# Patient Record
Sex: Female | Born: 2005 | Race: Black or African American | Hispanic: No | Marital: Single | Smoking: Never smoker
Health system: Southern US, Community
[De-identification: ages and names within clinical notes are randomized; demographics above are authoritative.]

## PROBLEM LIST (undated history)

## (undated) ENCOUNTER — Inpatient Hospital Stay (HOSPITAL_COMMUNITY): Payer: Self-pay

## (undated) ENCOUNTER — Ambulatory Visit: Payer: Self-pay | Source: Home / Self Care

## (undated) DIAGNOSIS — O139 Gestational [pregnancy-induced] hypertension without significant proteinuria, unspecified trimester: Secondary | ICD-10-CM

## (undated) DIAGNOSIS — A749 Chlamydial infection, unspecified: Secondary | ICD-10-CM

## (undated) HISTORY — PX: NO PAST SURGERIES: SHX2092

## (undated) HISTORY — DX: Gestational (pregnancy-induced) hypertension without significant proteinuria, unspecified trimester: O13.9

---
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
## 2023-01-19 IMAGING — US US PELVIS COMPLETE WITH TRANSVAGINAL
1 series · 13 of 25 positions shown · non-contrast
Comparison: None

CLINICAL DATA: Initial evaluation for lost IUD threads.



[Series 1: us pelvis complete with transvaginal · 54 acquisitions, 13 frames shown]
[im 1/54]
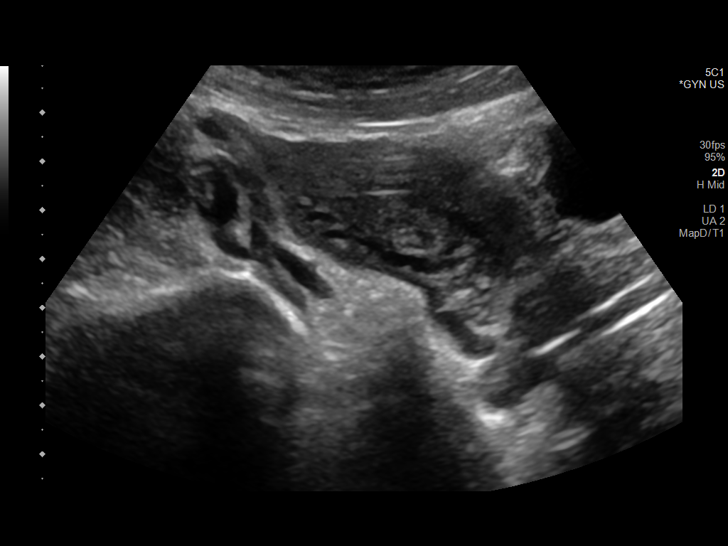
[im 5/54]
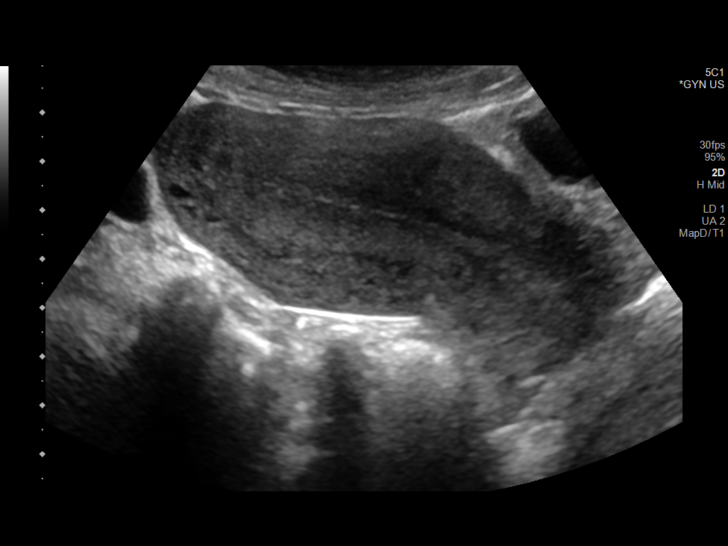
[im 9/54]
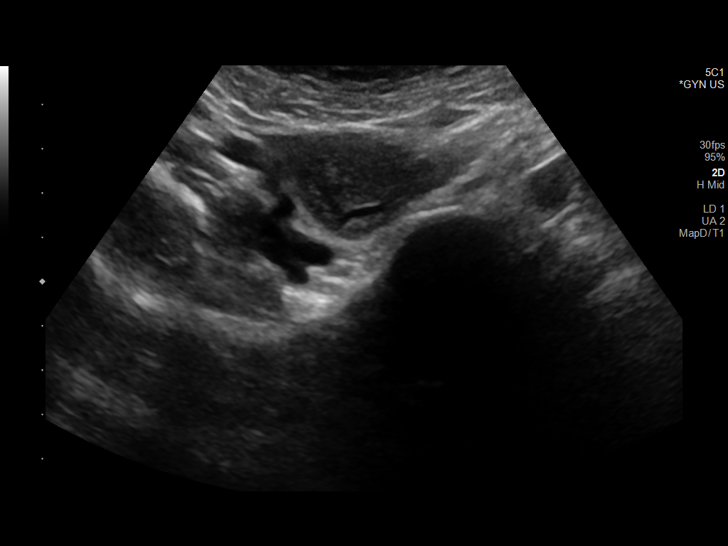
[im 14/54]
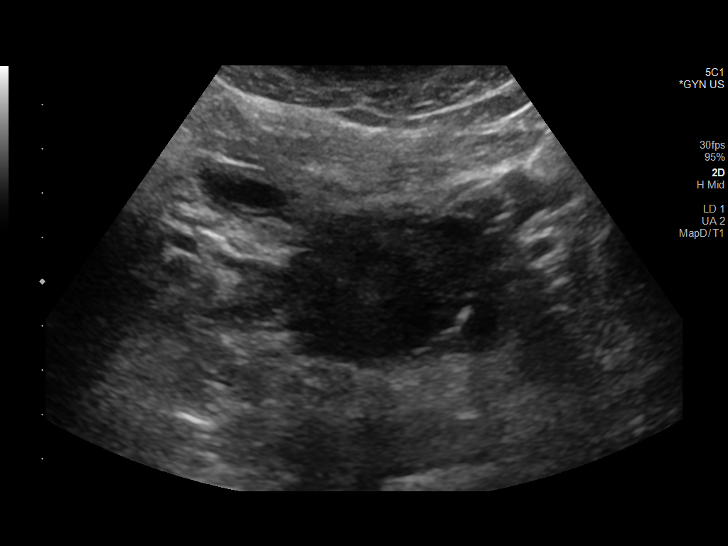
[im 18/54]
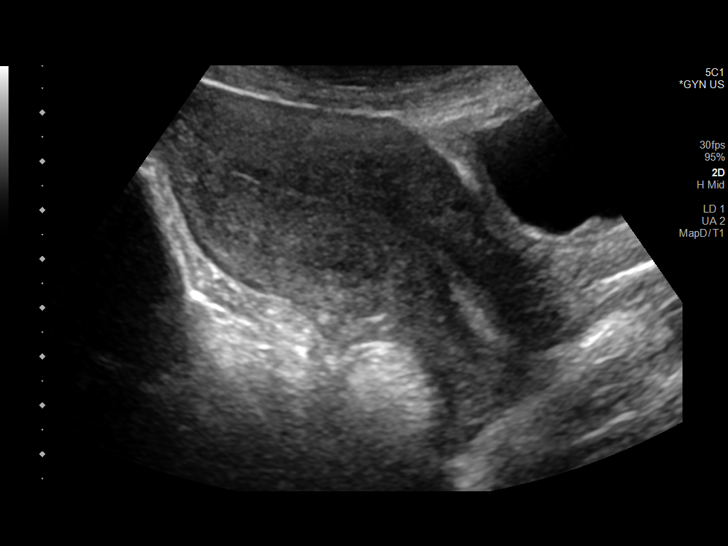
[im 23/54]
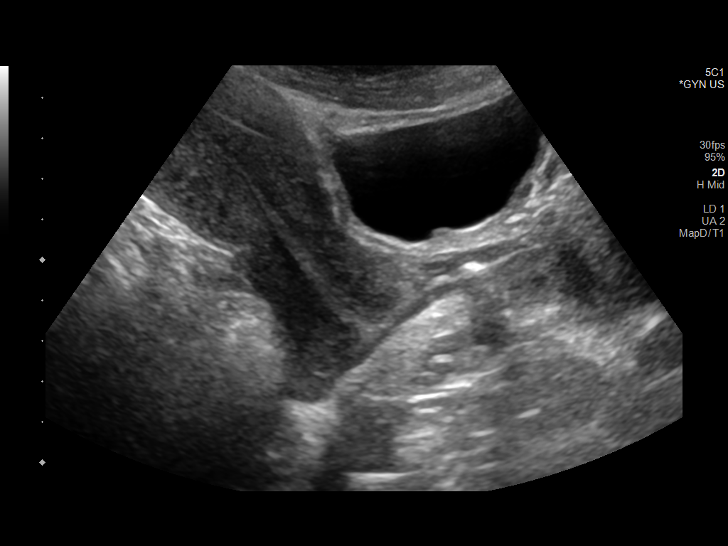
[im 27/54]
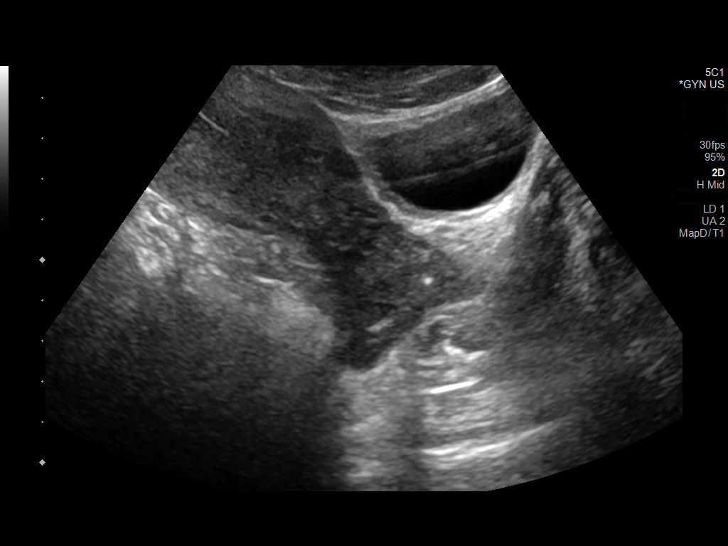
[im 31/54]
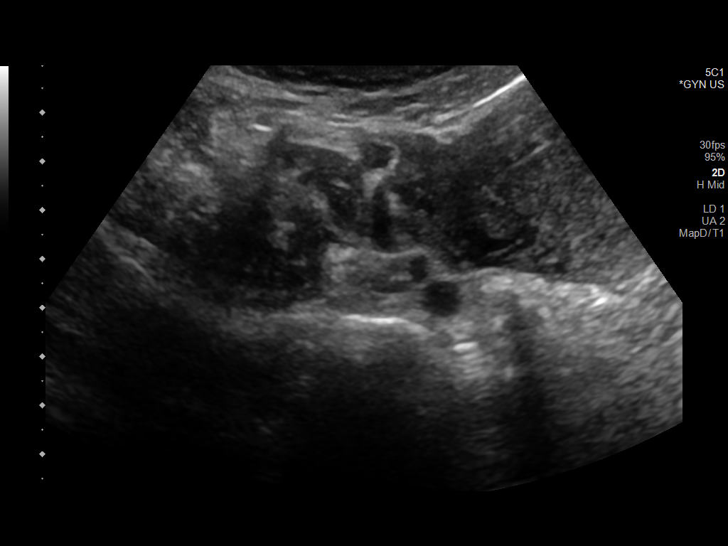
[im 36/54]
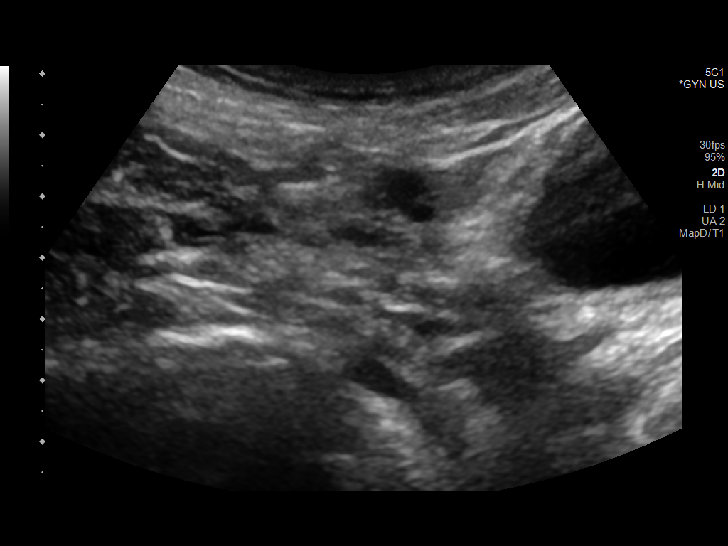
[im 40/54]
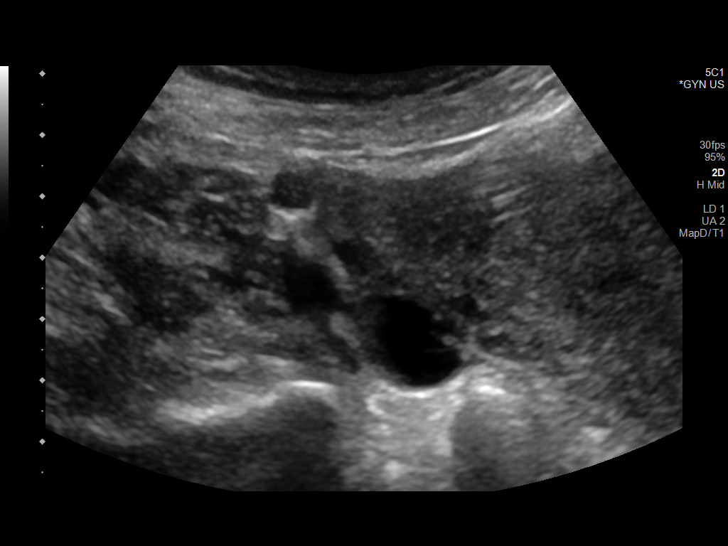
[im 45/54]
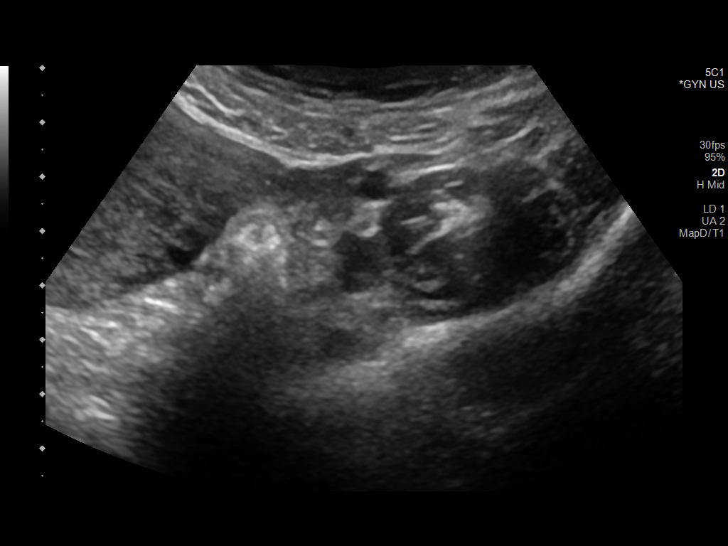
[im 49/54]
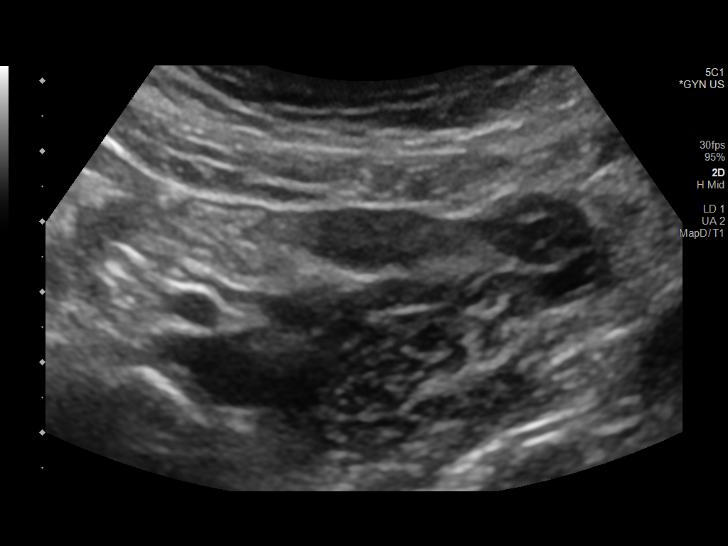
[im 54/54]
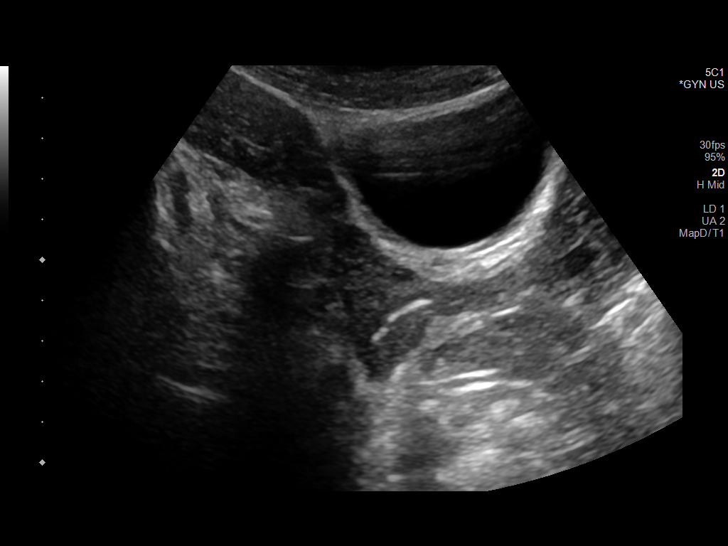

[13 of 25 positions shown; findings below may reference images not displayed]

FINDINGS: Uterus

Measurements: 9.3 x 3.8 x 5.7 cm = volume: 1 of 6 mL. Uterus is
anteverted. No discrete fibroid or other mass.

Endometrium

Thickness: 3.4 mm. No focal abnormality visualized. Linear echogenic
structure at the level of the lower uterine segment/endocervical
canal most likely reflects a malposition low lying IUD. This appears
to be transversely oriented (image 24).

Right ovary

Measurements: 3.5 x 2.3 x 1.7 cm = volume: 7.3 mL. Normal
appearance/no adnexal mass. 1.9 cm dominant follicle noted.

Left ovary

Measurements: 2.5 x 1.0 x 2.4 cm = volume: 3.0 mL. Normal
appearance/no adnexal mass.

Other findings

No abnormal free fluid.
IMPRESSION: 1. Malpositioned low lying IUD, transversely positioned at the level
of the lower uterine segment/endocervical canal.
2. Otherwise normal pelvic ultrasound.

## 2023-01-26 ENCOUNTER — Other Ambulatory Visit (HOSPITAL_COMMUNITY)
Admission: RE | Admit: 2023-01-26 | Discharge: 2023-01-26 | Disposition: A | Discharge status: Home / Self Care | Payer: Medicaid Other | Source: Ambulatory Visit | Attending: Family Medicine | Admitting: Family Medicine

## 2023-01-26 ENCOUNTER — Ambulatory Visit (INDEPENDENT_AMBULATORY_CARE_PROVIDER_SITE_OTHER): Payer: Medicaid Other

## 2023-01-26 VITALS — BP 116/63 | HR 77 | Wt 155.0 lb

## 2023-01-26 DIAGNOSIS — Z113 Encounter for screening for infections with a predominantly sexual mode of transmission: Secondary | ICD-10-CM | POA: Diagnosis not present

## 2023-01-26 NOTE — Progress Notes (Signed)
Patient presents for STD screening. Self swab was sent to the lab. chiquita l wilson, CMA

## 2023-01-27 LAB — CERVICOVAGINAL ANCILLARY ONLY
Bacterial Vaginitis (gardnerella): POSITIVE — AB
Candida Glabrata: NEGATIVE
Candida Vaginitis: NEGATIVE
Chlamydia: NEGATIVE
Comment: NEGATIVE
Comment: NEGATIVE
Comment: NEGATIVE
Comment: NEGATIVE
Comment: NEGATIVE
Comment: NORMAL
Neisseria Gonorrhea: NEGATIVE
Trichomonas: NEGATIVE

## 2023-01-30 MED ORDER — METRONIDAZOLE 1.3 % VA GEL: 1.0000 | Freq: Every day | VAGINAL | 0 refills | Status: DC

## 2023-01-30 NOTE — Addendum Note (Signed)
Addended by: Levie Heritage on: 01/30/2023 08:36 AM   Modules accepted: Orders

## 2023-01-31 ENCOUNTER — Other Ambulatory Visit: Payer: Self-pay

## 2023-01-31 ENCOUNTER — Other Ambulatory Visit (HOSPITAL_BASED_OUTPATIENT_CLINIC_OR_DEPARTMENT_OTHER): Payer: Self-pay

## 2023-01-31 DIAGNOSIS — B9689 Other specified bacterial agents as the cause of diseases classified elsewhere: Secondary | ICD-10-CM

## 2023-01-31 MED ORDER — METRONIDAZOLE 1.3 % VA GEL
1.0000 | Freq: Every day | VAGINAL | 0 refills | Status: AC
  Filled 2023-01-31: qty 5, 1d supply, fill #0

## 2023-01-31 NOTE — Progress Notes (Signed)
Patient called stating [facility] will not have her Rx for 2-3 days. Metrogel 1.3% GEL was sent to [facility] pharmacy. Understanding was voiced. Mary Weber l Blease Capaldi, CMA

## 2023-02-01 ENCOUNTER — Other Ambulatory Visit (HOSPITAL_BASED_OUTPATIENT_CLINIC_OR_DEPARTMENT_OTHER): Payer: Self-pay

## 2023-02-27 ENCOUNTER — Ambulatory Visit: Payer: Medicaid Other | Admitting: Obstetrics and Gynecology

## 2023-03-22 ENCOUNTER — Ambulatory Visit (INDEPENDENT_AMBULATORY_CARE_PROVIDER_SITE_OTHER): Payer: Medicaid Other

## 2023-03-22 VITALS — BP 120/58 | HR 68 | Wt 152.0 lb

## 2023-03-22 DIAGNOSIS — Z3201 Encounter for pregnancy test, result positive: Secondary | ICD-10-CM | POA: Diagnosis not present

## 2023-03-22 DIAGNOSIS — Z3490 Encounter for supervision of normal pregnancy, unspecified, unspecified trimester: Secondary | ICD-10-CM

## 2023-03-22 LAB — POCT URINE PREGNANCY: Preg Test, Ur: POSITIVE — AB

## 2023-03-22 NOTE — Progress Notes (Addendum)
Patient presents for UPT. UPT; positive. Advised patient to start prenatal vitamins and to schedule an appointment for prenatal care.  Keelyn Fjelstad l Fusae Florio, CMA   Attestation of Attending Supervision of CMA/RN: Evaluation and management procedures were performed by the nurse under my supervision and collaboration.  I have reviewed the nursing note and chart, and I agree with the management and plan.  Carolyn L. Harraway-Smith, M.D., [place] [place]

## 2023-03-23 ENCOUNTER — Other Ambulatory Visit: Payer: Self-pay | Admitting: Family Medicine

## 2023-03-23 ENCOUNTER — Ambulatory Visit (HOSPITAL_BASED_OUTPATIENT_CLINIC_OR_DEPARTMENT_OTHER)
Admission: RE | Admit: 2023-03-23 | Discharge: 2023-03-23 | Disposition: A | Discharge status: Home / Self Care | Payer: Medicaid Other | Source: Ambulatory Visit | Attending: Family Medicine | Admitting: Family Medicine

## 2023-03-23 DIAGNOSIS — Z3201 Encounter for pregnancy test, result positive: Secondary | ICD-10-CM

## 2023-03-23 DIAGNOSIS — Z3490 Encounter for supervision of normal pregnancy, unspecified, unspecified trimester: Secondary | ICD-10-CM

## 2023-03-23 DIAGNOSIS — Z3A01 Less than 8 weeks gestation of pregnancy: Secondary | ICD-10-CM | POA: Diagnosis not present

## 2023-03-23 DIAGNOSIS — Z3689 Encounter for other specified antenatal screening: Secondary | ICD-10-CM | POA: Insufficient documentation

## 2023-03-29 ENCOUNTER — Other Ambulatory Visit: Payer: Self-pay

## 2023-03-29 MED ORDER — VITAFOL FE+ 90-0.6-0.4-200 MG PO CAPS: 1.0000 | ORAL_CAPSULE | Freq: Every day | ORAL | 10 refills | Status: DC

## 2023-03-29 NOTE — Progress Notes (Signed)
Patient called requesting script for prenatal vitamins. Armandina Stammer RN

## 2023-03-30 ENCOUNTER — Other Ambulatory Visit (HOSPITAL_COMMUNITY)
Admission: RE | Admit: 2023-03-30 | Discharge: 2023-03-30 | Disposition: A | Discharge status: Home / Self Care | Payer: Medicaid Other | Source: Ambulatory Visit | Attending: Family Medicine | Admitting: Family Medicine

## 2023-03-30 ENCOUNTER — Ambulatory Visit (INDEPENDENT_AMBULATORY_CARE_PROVIDER_SITE_OTHER): Payer: Medicaid Other | Admitting: Family Medicine

## 2023-03-30 ENCOUNTER — Encounter: Payer: Self-pay | Admitting: Family Medicine

## 2023-03-30 VITALS — BP 118/62 | HR 76 | Wt 149.0 lb

## 2023-03-30 DIAGNOSIS — O3680X0 Pregnancy with inconclusive fetal viability, not applicable or unspecified: Secondary | ICD-10-CM

## 2023-03-30 DIAGNOSIS — Z1339 Encounter for screening examination for other mental health and behavioral disorders: Secondary | ICD-10-CM

## 2023-03-30 DIAGNOSIS — Z6830 Body mass index (BMI) 30.0-30.9, adult: Secondary | ICD-10-CM | POA: Diagnosis not present

## 2023-03-30 DIAGNOSIS — Z3480 Encounter for supervision of other normal pregnancy, unspecified trimester: Secondary | ICD-10-CM | POA: Insufficient documentation

## 2023-03-30 DIAGNOSIS — Z8759 Personal history of other complications of pregnancy, childbirth and the puerperium: Secondary | ICD-10-CM | POA: Diagnosis not present

## 2023-03-30 DIAGNOSIS — Z3A01 Less than 8 weeks gestation of pregnancy: Secondary | ICD-10-CM | POA: Diagnosis not present

## 2023-03-30 MED ORDER — ONDANSETRON 4 MG PO TBDP
4.0000 mg | ORAL_TABLET | Freq: Four times a day (QID) | ORAL | 3 refills | Status: DC | PRN
Start: 2023-03-30 — End: 2023-12-13

## 2023-03-30 MED ORDER — PRENATAL 28-0.8 MG PO TABS: 1.0000 | ORAL_TABLET | Freq: Every day | ORAL | 12 refills | Status: AC

## 2023-03-30 NOTE — Progress Notes (Signed)
Subjective:  Mary Weber is a G3P1001 [redacted]w[redacted]d confirmed by US done by me today being seen today for her first obstetrical visit.  Her obstetrical history is significant for  SVD with GHTN during labor . Patient does intend to breast feed. Pregnancy history fully reviewed.  Patient reports nausea.  BP 118/62   Pulse 76   Wt 149 lb (67.6 kg)   LMP 02/10/2023   BMI 30.09 kg/m   HISTORY: OB History  Gravida Para Term Preterm AB Living  3 1 1     1   SAB IAB Ectopic Multiple Live Births        0 1    # Outcome Date GA Lbr Len/2nd Weight Sex Type Anes PTL Lv  3 Current           2 Term 11/27/20 [redacted]w[redacted]d 11:50 / 00:28 6 lb 14.1 oz (3.121 kg) M Vag-Spont EPI  LIV  1 Gravida             Past Medical History:  Diagnosis Date   Chlamydia     Past Surgical History:  Procedure Laterality Date   NO PAST SURGERIES      Family History  Problem Relation Age of Onset   Healthy Mother    Healthy Father      Exam  BP 118/62   Pulse 76   Wt 149 lb (67.6 kg)   LMP 02/10/2023   BMI 30.09 kg/m   Chaperone present during exam  CONSTITUTIONAL: Well-developed, well-nourished female in no acute distress.  HENT:  Normocephalic, atraumatic, External right and left ear normal. Oropharynx is clear and moist EYES: Conjunctivae and EOM are normal. Pupils are equal, round, and reactive to light. No scleral icterus.  NECK: Normal range of motion, supple, no masses.  Normal thyroid.  CARDIOVASCULAR: Normal heart rate noted, regular rhythm RESPIRATORY: Clear to auscultation bilaterally. Effort and breath sounds normal, no problems with respiration noted. BREASTS: Declines ABDOMEN: Soft, normal bowel sounds, no distention noted.  No tenderness, rebound or guarding.  PELVIC: Declines MUSCULOSKELETAL: Normal range of motion. No tenderness.  No cyanosis, clubbing, or edema.  2+ distal pulses. SKIN: Skin is warm and dry. No rash noted. Not diaphoretic. No erythema. No pallor. NEUROLOGIC: Alert and  oriented to person, place, and time. Normal reflexes, muscle tone coordination. No cranial nerve deficit noted. PSYCHIATRIC: Normal mood and affect. Normal behavior. Normal judgment and thought content.    Assessment:    Pregnancy: G3P1001 Patient Active Problem List   Diagnosis Date Noted   Supervision of other normal pregnancy, antepartum 03/30/2023   BMI 30.0-30.9,adult 03/30/2023      Plan:   1. BMI 30.0-30.9,adult  2. Supervision of other normal pregnancy, antepartum FHT seen on [place] today - CBC/D/Plt+RPR+Rh+ABO+RubIgG... - Culture, OB Urine - GC/Chlamydia probe amp ([place])not at [facility] - [facility] AMB BABYSCRIPTS SCHEDULE OPTIMIZATION - [place] MFM OB DETAIL +14 WK; Future - US OB Comp Less 14 Wks; Future  3. Pregnancy with uncertain fetal viability, single or unspecified fetus  4. History of gestational hypertension ASA 81mg  at 12 weeks.     Initial labs obtained Continue prenatal vitamins Reviewed n/v relief measures and warning s/s to report Reviewed recommended weight gain based on pre-gravid BMI Encouraged well-balanced diet Genetic & carrier screening discussed: requests Panorama,  Ultrasound discussed; fetal survey: requested CCNC completed> form faxed if has or is planning to apply for medicaid The nature of [facility] [employer] with multiple MDs and other Advanced  Practice Providers was explained to patient; also emphasized that fellows, residents, and students are part of our team.  No indications for early GDM testing   Problem list reviewed and updated. 75% of 30 min visit spent on counseling and coordination of care.     Levie Heritage 03/30/2023

## 2023-03-31 ENCOUNTER — Telehealth (HOSPITAL_BASED_OUTPATIENT_CLINIC_OR_DEPARTMENT_OTHER): Payer: Self-pay

## 2023-03-31 DIAGNOSIS — Z8759 Personal history of other complications of pregnancy, childbirth and the puerperium: Secondary | ICD-10-CM | POA: Insufficient documentation

## 2023-03-31 LAB — GC/CHLAMYDIA PROBE AMP (~~LOC~~) NOT AT ARMC
Chlamydia: NEGATIVE
Comment: NEGATIVE
Comment: NORMAL
Neisseria Gonorrhea: NEGATIVE

## 2023-03-31 LAB — CBC/D/PLT+RPR+RH+ABO+RUBIGG...
Antibody Screen: NEGATIVE
Basophils Absolute: 0 10*3/uL (ref 0.0–0.2)
Basos: 1 %
EOS (ABSOLUTE): 0 10*3/uL (ref 0.0–0.4)
Eos: 0 %
HCV Ab: NONREACTIVE
HIV Screen 4th Generation wRfx: NONREACTIVE
Hematocrit: 37.9 % (ref 34.0–46.6)
Hemoglobin: 11.6 g/dL (ref 11.1–15.9)
Hepatitis B Surface Ag: NEGATIVE
Immature Grans (Abs): 0 10*3/uL (ref 0.0–0.1)
Immature Granulocytes: 0 %
Lymphocytes Absolute: 1.4 10*3/uL (ref 0.7–3.1)
Lymphs: 24 %
MCH: 27 pg (ref 26.6–33.0)
MCHC: 30.6 g/dL — ABNORMAL LOW (ref 31.5–35.7)
MCV: 88 fL (ref 79–97)
Monocytes Absolute: 0.4 10*3/uL (ref 0.1–0.9)
Monocytes: 6 %
Neutrophils Absolute: 4 10*3/uL (ref 1.4–7.0)
Neutrophils: 69 %
Platelets: 246 10*3/uL (ref 150–450)
RBC: 4.29 x10E6/uL (ref 3.77–5.28)
RDW: 12.6 % (ref 11.7–15.4)
RPR Ser Ql: NONREACTIVE
Rh Factor: POSITIVE
Rubella Antibodies, IGG: 18.1 index (ref >0.99)
WBC: 5.8 10*3/uL (ref 3.4–10.8)

## 2023-03-31 LAB — HCV INTERPRETATION

## 2023-04-01 ENCOUNTER — Ambulatory Visit (HOSPITAL_BASED_OUTPATIENT_CLINIC_OR_DEPARTMENT_OTHER): Payer: Medicaid Other

## 2023-04-01 LAB — URINE CULTURE, OB REFLEX

## 2023-04-01 LAB — CULTURE, OB URINE

## 2023-04-03 NOTE — Addendum Note (Signed)
Addended by: Levie Heritage on: 04/03/2023 08:35 AM   Modules accepted: Orders

## 2023-04-05 ENCOUNTER — Telehealth: Payer: Self-pay

## 2023-04-05 NOTE — Telephone Encounter (Signed)
Called patient to schedule a follow up US on 10/11. Was unable to leave a voicemail. Will send MyChart message to patient to give [place] a call.

## 2023-04-07 ENCOUNTER — Other Ambulatory Visit: Payer: Self-pay

## 2023-04-07 DIAGNOSIS — O219 Vomiting of pregnancy, unspecified: Secondary | ICD-10-CM

## 2023-04-07 MED ORDER — PROMETHAZINE HCL 25 MG PO TABS
25.0000 mg | ORAL_TABLET | Freq: Four times a day (QID) | ORAL | 0 refills | Status: AC | PRN
Start: 2023-04-07 — End: ?

## 2023-04-07 NOTE — Progress Notes (Signed)
Patient called requesting a medication for nausea and vomiting. Phenergan 25 mg take 1 tablet PO daily. Eryn Marandola l Nathan Stallworth, CMA

## 2023-04-12 ENCOUNTER — Telehealth: Payer: Self-pay

## 2023-04-12 ENCOUNTER — Other Ambulatory Visit: Payer: Self-pay

## 2023-04-12 ENCOUNTER — Inpatient Hospital Stay (HOSPITAL_COMMUNITY)
Admission: AD | Admit: 2023-04-12 | Discharge: 2023-04-12 | Disposition: A | Discharge status: Home / Self Care | Payer: Medicaid Other | Attending: Obstetrics & Gynecology | Admitting: Obstetrics & Gynecology

## 2023-04-12 DIAGNOSIS — Z6830 Body mass index (BMI) 30.0-30.9, adult: Secondary | ICD-10-CM

## 2023-04-12 DIAGNOSIS — O219 Vomiting of pregnancy, unspecified: Secondary | ICD-10-CM | POA: Diagnosis present

## 2023-04-12 DIAGNOSIS — O99281 Endocrine, nutritional and metabolic diseases complicating pregnancy, first trimester: Secondary | ICD-10-CM | POA: Insufficient documentation

## 2023-04-12 DIAGNOSIS — E860 Dehydration: Secondary | ICD-10-CM | POA: Diagnosis not present

## 2023-04-12 DIAGNOSIS — Z3480 Encounter for supervision of other normal pregnancy, unspecified trimester: Secondary | ICD-10-CM

## 2023-04-12 DIAGNOSIS — Z3A08 8 weeks gestation of pregnancy: Secondary | ICD-10-CM | POA: Insufficient documentation

## 2023-04-12 LAB — URINALYSIS, ROUTINE W REFLEX MICROSCOPIC
Bilirubin Urine: NEGATIVE
Glucose, UA: NEGATIVE mg/dL
Hgb urine dipstick: NEGATIVE
Ketones, ur: 80 mg/dL — AB
Leukocytes,Ua: NEGATIVE
Nitrite: NEGATIVE
Protein, ur: 100 mg/dL — AB
Specific Gravity, Urine: 1.029 (ref 1.005–1.030)
pH: 6 (ref 5.0–8.0)

## 2023-04-12 MED ORDER — SCOPOLAMINE 1 MG/3DAYS TD PT72
1.0000 | MEDICATED_PATCH | Freq: Once | TRANSDERMAL | Status: DC
  Administered 2023-04-12: 1.5 mg via TRANSDERMAL
  Filled 2023-04-12: qty 1

## 2023-04-12 MED ORDER — ONDANSETRON 4 MG PO TBDP
4.0000 mg | ORAL_TABLET | Freq: Once | ORAL | Status: AC
  Administered 2023-04-12: 4 mg via ORAL
  Filled 2023-04-12: qty 1

## 2023-04-12 MED ORDER — LACTATED RINGERS IV BOLUS
1000.0000 mL | Freq: Once | INTRAVENOUS | Status: AC
  Administered 2023-04-12: 1000 mL via INTRAVENOUS

## 2023-04-12 MED ORDER — SCOPOLAMINE 1 MG/3DAYS TD PT72
1.0000 | MEDICATED_PATCH | Freq: Once | TRANSDERMAL | Status: DC
  Filled 2023-04-12: qty 1

## 2023-04-12 MED ORDER — TRANSDERM-SCOP 1 MG/3DAYS TD PT72: 1.0000 | MEDICATED_PATCH | TRANSDERMAL | 0 refills | Status: AC

## 2023-04-12 MED ORDER — FAMOTIDINE 20 MG PO TABS
20.0000 mg | ORAL_TABLET | Freq: Once | ORAL | Status: AC
  Administered 2023-04-12: 20 mg via ORAL
  Filled 2023-04-12: qty 1

## 2023-04-12 MED ORDER — PANTOPRAZOLE SODIUM 20 MG PO TBEC: 20.0000 mg | DELAYED_RELEASE_TABLET | Freq: Every day | ORAL | 1 refills | Status: DC

## 2023-04-12 NOTE — MAU Provider Note (Signed)
Chief Complaint: No chief complaint on file.   Event Date/Time   First Provider Initiated Contact with Patient 04/12/23 2041        SUBJECTIVE HPI: Mary Weber is a 17 y.o. G3P1001 at [redacted]w[redacted]d by LMP who presents to maternity admissions reporting nausea and vomiting.  Has only vomited once all day. States cannot eat or drink but has been drinking orange juice and ate BBQ and Stew yesterday.  Has Zofran and Phenergan at home that don't help. . She denies vaginal bleeding, vaginal itching/burning, urinary symptoms, h/a, dizziness, or fever/chills.    Emesis  This is a recurrent problem. The current episode started today. The problem occurs less than 2 times per day. There has been no fever. Pertinent negatives include no abdominal pain or chills. Treatments tried: phenergan and zofran.   RN note: Mary Weber is a 17 y.o. at 109w5d here in MAU reporting: she's unable to keep anything down despite taking prescribed meds. Reports last took Phenergan @ 1230. States doesn't have an appetite, has vomited once in past 24 hours secondary hasn't eaten.  Denies VB. LMP: NA Onset of complaint: today  Past Medical History:  Diagnosis Date   Chlamydia    Past Surgical History:  Procedure Laterality Date   NO PAST SURGERIES     Social History   Socioeconomic History   Marital status: Single    Spouse name: Not on file   Number of children: Not on file   Years of education: Not on file   Highest education level: Not on file  Occupational History   Not on file  Tobacco Use   Smoking status: Never   Smokeless tobacco: Never  Vaping Use   Vaping status: Never Used  Substance and Sexual Activity   Alcohol use: Never   Drug use: Never   Sexual activity: Not Currently  Other Topics Concern   Not on file  Social History Narrative   Not on file   Social Determinants of Health   Financial Resource Strain: Not on file  Food Insecurity: Low Risk  (02/13/2023)   Received from [facility]    Hunger Vital Sign    Worried About Running Out of Food in the Last Year: Never true    Ran Out of Food in the Last Year: Never true  Transportation Needs: Not on file (02/13/2023)  Physical Activity: Not on file  Stress: Not on file  Social Connections: Not on file  Intimate Partner Violence: Not on file   No current facility-administered medications on file prior to encounter.   Current Outpatient Medications on File Prior to Encounter  Medication Sig Dispense Refill   ondansetron (ZOFRAN-ODT) 4 MG disintegrating tablet Take 1 tablet (4 mg total) by mouth every 6 (six) hours as needed for nausea. 60 tablet 3   Prenat-Fe Poly-Methfol-FA-DHA (VITAFOL FE+) 90-0.6-0.4-200 MG CAPS Take 1 Capful by mouth daily. (Patient not taking: Reported on 03/30/2023) 30 capsule 10   Prenatal 28-0.8 MG TABS Take 1 tablet by mouth daily. 30 tablet 12   promethazine (PHENERGAN) 25 MG tablet Take 1 tablet (25 mg total) by mouth every 6 (six) hours as needed for nausea or vomiting. 30 tablet 0   Allergies  Allergen Reactions   Bee Pollen Itching    Pt reports pollen     I have reviewed patient's Past Medical Hx, Surgical Hx, Family Hx, Social Hx, medications and allergies.   ROS:  Review of Systems  Constitutional:  Negative for chills.  Gastrointestinal:  Positive for vomiting. Negative for abdominal pain.   Review of Systems  Other systems negative   Physical Exam  Physical Exam Patient Vitals for the past 24 hrs:  BP Temp Temp src Pulse Resp SpO2 Height Weight  04/12/23 1735 115/66 98 F (36.7 C) Oral 88 18 100 % -- --  04/12/23 1731 -- -- -- -- -- -- 4\' 11"  (1.499 m) 64.3 kg   Constitutional: Well-developed, well-nourished female in no acute distress.  Cardiovascular: normal rate Respiratory: normal effort GI: Abd soft, non-tender. MS: Extremities nontender, no edema, normal ROM Neurologic: Alert and oriented x 4.  GU: Neg CVAT.  PELVIC EXAM: deferred   LAB RESULTS Results for  orders placed or performed during the hospital encounter of 04/12/23 (from the past 24 hour(s))  Urinalysis, Routine w reflex microscopic -Urine, Clean Catch     Status: Abnormal   Collection Time: 04/12/23  5:29 PM  Result Value Ref Range   Color, Urine YELLOW YELLOW   APPearance HAZY (A) CLEAR   Specific Gravity, Urine 1.029 1.005 - 1.030   pH 6.0 5.0 - 8.0   Glucose, UA NEGATIVE NEGATIVE mg/dL   Hgb urine dipstick NEGATIVE NEGATIVE   Bilirubin Urine NEGATIVE NEGATIVE   Ketones, ur 80 (A) NEGATIVE mg/dL   Protein, ur 086 (A) NEGATIVE mg/dL   Nitrite NEGATIVE NEGATIVE   Leukocytes,Ua NEGATIVE NEGATIVE   RBC / HPF 0-5 0 - 5 RBC/hpf   WBC, UA 0-5 0 - 5 WBC/hpf   Bacteria, UA RARE (A) NONE SEEN   Squamous Epithelial / HPF 11-20 0 - 5 /HPF   Mucus PRESENT     O/Positive/-- (09/19 0955)  IMAGING   MAU Management/MDM: I have reviewed the triage vital signs and the nursing notes.   Pertinent labs & imaging results that were available during my care of the patient were reviewed by me and considered in my medical decision making (see chart for details).      I have reviewed her medical records including past results, notes and treatments. Medical, Surgical, and family history were reviewed.  Medications and recent lab tests were reviewed    Treatments in MAU included LR infusion, pepcid, zofran and scopolamine patch.  Felt much better afterward and was able to tolerate PO intake.   ASSESSMENT Single IUP at [redacted]w[redacted]d Nausea and vomiting Mild dehydration  PLAN Discharge home Will add new Rx for Scopolamine and Protonix Advance diet as tolerated Pt stable at time of discharge. Encouraged to return here if she develops worsening of symptoms, increase in pain, fever, or other concerning symptoms.    Wynelle Bourgeois CNM, MSN Certified Nurse-Midwife 04/12/2023  8:42 PM

## 2023-04-12 NOTE — MAU Note (Signed)
Mary Weber is a 17 y.o. at [redacted]w[redacted]d here in MAU reporting: she's unable to keep anything down despite taking prescribed meds. Reports last took Phenergan @ 1230. States doesn't have an appetite, has vomited once in past 24 hours secondary hasn't eaten.  Denies VB. LMP: NA Onset of complaint: today Pain score: 0 Vitals:   04/12/23 1735  BP: 115/66  Pulse: 88  Resp: 18  Temp: 98 F (36.7 C)  SpO2: 100%     FHT:NA Lab orders placed from triage:   UA

## 2023-04-12 NOTE — Telephone Encounter (Signed)
Patient called stating she has tried taking Zofran and Phenergan but neither medication has provided any relief. Patient states she is unable to keep anything down. Advised patient to go to [facility] [address] to be seen. Understanding was voiced. Yi Falletta l Shae Hinnenkamp, CMA

## 2023-04-19 ENCOUNTER — Ambulatory Visit: Payer: Medicaid Other | Admitting: Obstetrics and Gynecology

## 2023-04-21 ENCOUNTER — Other Ambulatory Visit (INDEPENDENT_AMBULATORY_CARE_PROVIDER_SITE_OTHER): Payer: Medicaid Other

## 2023-04-21 ENCOUNTER — Encounter: Payer: Self-pay | Admitting: *Deleted

## 2023-04-21 ENCOUNTER — Ambulatory Visit: Payer: Medicaid Other | Admitting: *Deleted

## 2023-04-21 ENCOUNTER — Other Ambulatory Visit (HOSPITAL_BASED_OUTPATIENT_CLINIC_OR_DEPARTMENT_OTHER): Payer: Self-pay

## 2023-04-21 VITALS — BP 121/76 | HR 93 | Wt 139.0 lb

## 2023-04-21 DIAGNOSIS — Z3480 Encounter for supervision of other normal pregnancy, unspecified trimester: Secondary | ICD-10-CM

## 2023-04-21 DIAGNOSIS — Z3A09 9 weeks gestation of pregnancy: Secondary | ICD-10-CM | POA: Diagnosis not present

## 2023-04-21 DIAGNOSIS — Z3481 Encounter for supervision of other normal pregnancy, first trimester: Secondary | ICD-10-CM

## 2023-04-21 DIAGNOSIS — Z6830 Body mass index (BMI) 30.0-30.9, adult: Secondary | ICD-10-CM

## 2023-04-21 MED ORDER — TRANSDERM-SCOP 1 MG/3DAYS TD PT72
1.0000 | MEDICATED_PATCH | TRANSDERMAL | 12 refills | Status: DC
Start: 2023-04-21 — End: 2024-02-27
  Filled 2023-04-21 (×2): qty 10, 30d supply, fill #0

## 2023-04-21 NOTE — Progress Notes (Signed)
I explained I am completing a repeat scan today for viability. We discussed EDD of 11/17/2023, by Last Menstrual Period. Pt is G3P1001. I reviewed her allergies, medications and Medical/Surgical/OB history.    Patient Active Problem List   Diagnosis Date Noted   History of gestational hypertension 03/31/2023   Supervision of other normal pregnancy, antepartum 03/30/2023   BMI 30.0-30.9,adult 03/30/2023    Concerns addressed today  Patient informed that the ultrasound is considered a limited obstetric ultrasound and is not intended to be a complete ultrasound exam.  Patient also informed that the ultrasound is not being completed with the intent of assessing for fetal or placental anomalies or any pelvic abnormalities. Explained that the purpose of today's ultrasound is to assess for viability.  Patient acknowledges the purpose of the exam and the limitations of the study.     Pt to follow up at [facility], RN 04/21/2023  11:42 AM

## 2023-04-28 ENCOUNTER — Encounter: Payer: Medicaid Other | Admitting: Obstetrics and Gynecology

## 2023-05-08 ENCOUNTER — Other Ambulatory Visit (HOSPITAL_BASED_OUTPATIENT_CLINIC_OR_DEPARTMENT_OTHER): Payer: Self-pay

## 2023-05-09 ENCOUNTER — Other Ambulatory Visit (HOSPITAL_BASED_OUTPATIENT_CLINIC_OR_DEPARTMENT_OTHER): Payer: Self-pay

## 2023-05-19 ENCOUNTER — Other Ambulatory Visit (HOSPITAL_BASED_OUTPATIENT_CLINIC_OR_DEPARTMENT_OTHER): Payer: Self-pay

## 2023-05-25 ENCOUNTER — Encounter: Payer: Medicaid Other | Admitting: Family Medicine

## 2023-06-22 ENCOUNTER — Ambulatory Visit
Admission: RE | Admit: 2023-06-22 | Discharge: 2023-06-22 | Disposition: A | Discharge status: Home / Self Care | Payer: Self-pay | Source: Ambulatory Visit | Attending: Internal Medicine | Admitting: Internal Medicine

## 2023-06-22 ENCOUNTER — Ambulatory Visit: Payer: Self-pay

## 2023-06-22 ENCOUNTER — Ambulatory Visit: Payer: Medicaid Other | Attending: Family Medicine

## 2023-06-22 ENCOUNTER — Ambulatory Visit: Payer: Medicaid Other

## 2023-06-22 VITALS — BP 124/83 | HR 80 | Temp 98.8°F | Resp 16

## 2023-06-22 DIAGNOSIS — N760 Acute vaginitis: Secondary | ICD-10-CM | POA: Insufficient documentation

## 2023-06-22 DIAGNOSIS — R300 Dysuria: Secondary | ICD-10-CM | POA: Insufficient documentation

## 2023-06-22 LAB — POCT URINALYSIS DIP (MANUAL ENTRY)
Bilirubin, UA: NEGATIVE
Glucose, UA: NEGATIVE mg/dL
Ketones, POC UA: NEGATIVE mg/dL
Nitrite, UA: NEGATIVE
Protein Ur, POC: NEGATIVE mg/dL
Spec Grav, UA: >=1.03 — AB (ref 1.010–1.025)
Urobilinogen, UA: 0.2 U/dL
pH, UA: 6 (ref 5.0–8.0)

## 2023-06-22 LAB — POCT URINE PREGNANCY: Preg Test, Ur: NEGATIVE

## 2023-06-22 NOTE — ED Provider Notes (Signed)
[facility]  Note:  This document was prepared using Conservation officer, historic buildings and may include unintentional dictation errors.  MRN: [id number] DOB: Nov 12, 2005  Subjective:   Mary Weber is a 17 y.o. female presenting for 1 week history of acute onset persistent vaginal discharge and vaginal irritation.  In the past 3 days has started to have dysuria, slight urinary frequency.  Patient has had infrequent BV and yeast infection.  She tries to hydrate well and generally goes for 64 ounces daily.  She is requesting STI check but is not particularly concerned that this is the case.  No current facility-administered medications for this encounter.  Current Outpatient Medications:    ondansetron (ZOFRAN-ODT) 4 MG disintegrating tablet, Take 1 tablet (4 mg total) by mouth every 6 (six) hours as needed for nausea., Disp: 60 tablet, Rfl: 3   pantoprazole (PROTONIX) 20 MG tablet, Take 1 tablet (20 mg total) by mouth daily., Disp: 30 tablet, Rfl: 1   Prenat-Fe Poly-Methfol-FA-DHA (VITAFOL FE+) 90-0.6-0.4-200 MG CAPS, Take 1 Capful by mouth daily. (Patient not taking: Reported on 03/30/2023), Disp: 30 capsule, Rfl: 10   Prenatal 28-0.8 MG TABS, Take 1 tablet by mouth daily., Disp: 30 tablet, Rfl: 12   promethazine (PHENERGAN) 25 MG tablet, Take 1 tablet (25 mg total) by mouth every 6 (six) hours as needed for nausea or vomiting., Disp: 30 tablet, Rfl: 0   scopolamine (TRANSDERM-SCOP) 1 MG/3DAYS, Place 1 patch (1.5 mg total) onto the skin every 3 (three) days., Disp: 10 patch, Rfl: 0   scopolamine (TRANSDERM-SCOP) 1 MG/3DAYS, Place 1 patch (1.5 mg total) onto the skin every 3 (three) days., Disp: 10 patch, Rfl: 12   Allergies  Allergen Reactions   Bee Pollen Itching    Pt reports pollen     Past Medical History:  Diagnosis Date   Chlamydia      Past Surgical History:  Procedure Laterality Date   NO PAST SURGERIES      Family History  Problem Relation Age  of Onset   Healthy Mother    Healthy Father     Social History   Tobacco Use   Smoking status: Never   Smokeless tobacco: Never  Vaping Use   Vaping status: Never Used  Substance Use Topics   Alcohol use: Never   Drug use: Never    ROS   Objective:   Vitals: BP 124/83 (BP Location: Left Arm)   Pulse 80   Temp 98.8 F (37.1 C) (Oral)   Resp 16   LMP 06/22/2023   SpO2 99%   Physical Exam Constitutional:      General: She is not in acute distress.    Appearance: Normal appearance. She is well-developed. She is not ill-appearing, toxic-appearing or diaphoretic.  HENT:     Head: Normocephalic and atraumatic.     Nose: Nose normal.     Mouth/Throat:     Mouth: Mucous membranes are moist.     Pharynx: Oropharynx is clear.  Eyes:     General: No scleral icterus.       Right eye: No discharge.        Left eye: No discharge.     Extraocular Movements: Extraocular movements intact.     Conjunctiva/sclera: Conjunctivae normal.  Cardiovascular:     Rate and Rhythm: Normal rate.  Pulmonary:     Effort: Pulmonary effort is normal.  Abdominal:     General: Bowel sounds are normal. There is no distension.  Palpations: Abdomen is soft. There is no mass.     Tenderness: There is no abdominal tenderness. There is no right CVA tenderness, left CVA tenderness, guarding or rebound.  Skin:    General: Skin is warm and dry.  Neurological:     General: No focal deficit present.     Mental Status: She is alert and oriented to person, place, and time.  Psychiatric:        Mood and Affect: Mood normal.        Behavior: Behavior normal.        Thought Content: Thought content normal.        Judgment: Judgment normal.     Results for orders placed or performed during the hospital encounter of 06/22/23 (from the past 24 hours)  POCT urinalysis dipstick     Status: Abnormal   Collection Time: 06/22/23  1:50 PM  Result Value Ref Range   Color, UA yellow yellow   Clarity, UA  cloudy (A) clear   Glucose, UA negative negative mg/dL   Bilirubin, UA negative negative   Ketones, POC UA negative negative mg/dL   Spec Grav, UA >=0.981 (A) 1.010 - 1.025   Blood, UA large (A) negative   pH, UA 6.0 5.0 - 8.0   Protein Ur, POC negative negative mg/dL   Urobilinogen, UA 0.2 0.2 or 1.0 E.U./dL   Nitrite, UA Negative Negative   Leukocytes, UA Small (1+) (A) Negative  POCT urine pregnancy     Status: None   Collection Time: 06/22/23  1:50 PM  Result Value Ref Range   Preg Test, Ur Negative Negative    Assessment and Plan :   PDMP not reviewed this encounter.  1. Acute vaginitis   2. Dysuria    Patient declined empiric treatment.  Testing pending, will treat as appropriate based off of lab results.    Wallis Bamberg, [place] 06/22/23 1439

## 2023-06-22 NOTE — ED Triage Notes (Signed)
Pt requesting STD testing-c/o vaginal d/c and irritation x 1 week-dysuria x 2-3 days-NAD-steady gait

## 2023-06-22 NOTE — Discharge Instructions (Addendum)
At your request we will hold off on treatments until we see your vaginal cytology tomorrow. Make sure you hydrate very well with plain water and a quantity of 64 ounces of water a day.  Please limit drinks that are considered urinary irritants such as soda, sweet tea, coffee, energy drinks, alcohol.  These can worsen your urinary and genital symptoms but also be the source of them.  I will let you know about your urine culture results through MyChart to see if we need to prescribe or change your antibiotics based off of those results.

## 2023-06-23 ENCOUNTER — Telehealth (HOSPITAL_COMMUNITY): Payer: Self-pay

## 2023-06-23 LAB — URINE CULTURE: Culture: 10000 — AB

## 2023-06-23 LAB — CERVICOVAGINAL ANCILLARY ONLY
Bacterial Vaginitis (gardnerella): POSITIVE — AB
Candida Glabrata: NEGATIVE
Candida Vaginitis: POSITIVE — AB
Chlamydia: NEGATIVE
Comment: NEGATIVE
Comment: NEGATIVE
Comment: NEGATIVE
Comment: NEGATIVE
Comment: NEGATIVE
Comment: NORMAL
Neisseria Gonorrhea: NEGATIVE
Trichomonas: NEGATIVE

## 2023-06-23 MED ORDER — FLUCONAZOLE 150 MG PO TABS: 150.0000 mg | ORAL_TABLET | Freq: Once | ORAL | 0 refills | Status: AC

## 2023-06-23 MED ORDER — METRONIDAZOLE 0.75 % VA GEL: 1.0000 | Freq: Every day | VAGINAL | 0 refills | Status: AC

## 2023-06-23 MED ORDER — METRONIDAZOLE 500 MG PO TABS: 500.0000 mg | ORAL_TABLET | Freq: Two times a day (BID) | ORAL | 0 refills | Status: AC

## 2023-06-23 NOTE — Addendum Note (Signed)
Addended by: Warren Danes on: 06/23/2023 03:00 PM   Modules accepted: Orders

## 2023-06-23 NOTE — Telephone Encounter (Signed)
Per protocol, pt requires tx with metronidazole and Diflucan.  Rx sent to pharmacy on file.

## 2023-06-23 NOTE — Telephone Encounter (Signed)
Rx changed to metrogel per pt request

## 2023-07-27 ENCOUNTER — Ambulatory Visit (INDEPENDENT_AMBULATORY_CARE_PROVIDER_SITE_OTHER): Payer: Self-pay

## 2023-07-27 ENCOUNTER — Other Ambulatory Visit (HOSPITAL_COMMUNITY)
Admission: RE | Admit: 2023-07-27 | Discharge: 2023-07-27 | Disposition: A | Discharge status: Home / Self Care | Payer: Medicaid Other | Source: Ambulatory Visit | Attending: Family Medicine | Admitting: Family Medicine

## 2023-07-27 VITALS — BP 130/74 | HR 68 | Ht 59.0 in | Wt 133.0 lb

## 2023-07-27 DIAGNOSIS — Z113 Encounter for screening for infections with a predominantly sexual mode of transmission: Secondary | ICD-10-CM | POA: Insufficient documentation

## 2023-07-27 DIAGNOSIS — N898 Other specified noninflammatory disorders of vagina: Secondary | ICD-10-CM | POA: Diagnosis present

## 2023-07-27 NOTE — Progress Notes (Signed)
SUBJECTIVE:  18 y.o. female complains of foul and frothy vaginal discharge for 5 day(s). Denies abnormal vaginal bleeding or significant pelvic pain or fever. No UTI symptoms. Denies history of known exposure to STD.  Patient's last menstrual period was 07/20/2023 (exact date).  OBJECTIVE:  She appears well, afebrile. Urine dipstick: not done.  ASSESSMENT:  Vaginal Discharge  Vaginal Odor   PLAN:  GC, chlamydia, trichomonas, BVAG, CVAG probe sent to lab. Treatment: To be determined once lab results are received ROV prn if symptoms persist or worsen.

## 2023-07-31 LAB — CERVICOVAGINAL ANCILLARY ONLY
Bacterial Vaginitis (gardnerella): POSITIVE — AB
Candida Glabrata: NEGATIVE
Candida Vaginitis: NEGATIVE
Chlamydia: NEGATIVE
Comment: NEGATIVE
Comment: NEGATIVE
Comment: NEGATIVE
Comment: NEGATIVE
Comment: NEGATIVE
Comment: NORMAL
Neisseria Gonorrhea: NEGATIVE
Trichomonas: NEGATIVE

## 2023-08-01 ENCOUNTER — Encounter: Payer: Self-pay | Admitting: Family Medicine

## 2023-08-01 MED ORDER — METRONIDAZOLE 1.3 % VA GEL: 1.0000 | Freq: Every day | VAGINAL | 0 refills | Status: AC

## 2023-08-01 NOTE — Addendum Note (Signed)
Addended by: Levie Heritage on: 08/01/2023 10:58 AM   Modules accepted: Orders

## 2023-08-02 ENCOUNTER — Encounter: Payer: Self-pay | Admitting: Family Medicine

## 2023-08-03 ENCOUNTER — Other Ambulatory Visit: Payer: Self-pay

## 2023-08-03 DIAGNOSIS — N760 Acute vaginitis: Secondary | ICD-10-CM

## 2023-08-03 MED ORDER — CLINDAMYCIN PHOSPHATE 2 % VA CREA
1.0000 | TOPICAL_CREAM | Freq: Every day | VAGINAL | Status: AC
Start: 2023-08-03 — End: 2023-08-10

## 2023-08-04 ENCOUNTER — Other Ambulatory Visit: Payer: Self-pay

## 2023-08-04 DIAGNOSIS — B9689 Other specified bacterial agents as the cause of diseases classified elsewhere: Secondary | ICD-10-CM

## 2023-08-04 MED ORDER — CLINDAMYCIN PHOSPHATE 2 % VA CREA
1.0000 | TOPICAL_CREAM | Freq: Every day | VAGINAL | 0 refills | Status: AC
Start: 2023-08-04 — End: 2023-08-11

## 2023-09-12 ENCOUNTER — Other Ambulatory Visit (HOSPITAL_COMMUNITY)
Admission: RE | Admit: 2023-09-12 | Discharge: 2023-09-12 | Disposition: A | Discharge status: Home / Self Care | Source: Ambulatory Visit | Attending: Obstetrics and Gynecology | Admitting: Obstetrics and Gynecology

## 2023-09-12 ENCOUNTER — Ambulatory Visit (INDEPENDENT_AMBULATORY_CARE_PROVIDER_SITE_OTHER): Payer: Self-pay

## 2023-09-12 VITALS — BP 137/56 | HR 72 | Wt 125.0 lb

## 2023-09-12 DIAGNOSIS — N898 Other specified noninflammatory disorders of vagina: Secondary | ICD-10-CM | POA: Insufficient documentation

## 2023-09-12 DIAGNOSIS — Z113 Encounter for screening for infections with a predominantly sexual mode of transmission: Secondary | ICD-10-CM

## 2023-09-12 NOTE — Progress Notes (Signed)
 SUBJECTIVE:  18 y.o. female complains of white vaginal discharge for 3 day(s). Denies abnormal vaginal bleeding or significant pelvic pain or fever. No UTI symptoms. Denies history of known exposure to STD.  Patient's last menstrual period was 08/16/2023 (exact date).  OBJECTIVE:  She appears well, afebrile. Urine dipstick: not done.  ASSESSMENT:  Vaginal Discharge  Vaginal Odor   PLAN:  GC, chlamydia, trichomonas, BVAG, CVAG probe sent to lab. Treatment: To be determined once lab results are received ROV prn if symptoms persist or worsen.

## 2023-09-13 ENCOUNTER — Other Ambulatory Visit: Payer: Self-pay | Admitting: Obstetrics and Gynecology

## 2023-09-13 DIAGNOSIS — Z308 Encounter for other contraceptive management: Secondary | ICD-10-CM

## 2023-09-14 LAB — CERVICOVAGINAL ANCILLARY ONLY
Bacterial Vaginitis (gardnerella): POSITIVE — AB
Candida Glabrata: NEGATIVE
Candida Vaginitis: NEGATIVE
Chlamydia: NEGATIVE
Comment: NEGATIVE
Comment: NEGATIVE
Comment: NEGATIVE
Comment: NEGATIVE
Comment: NEGATIVE
Comment: NORMAL
Neisseria Gonorrhea: NEGATIVE
Trichomonas: NEGATIVE

## 2023-09-15 MED ORDER — METRONIDAZOLE 500 MG PO TABS
500.0000 mg | ORAL_TABLET | Freq: Two times a day (BID) | ORAL | 0 refills | Status: DC
Start: 2023-09-15 — End: 2023-09-20

## 2023-09-15 NOTE — Addendum Note (Signed)
 Addended by: Sue Lush on: 09/15/2023 08:15 AM   Modules accepted: Orders

## 2023-09-20 ENCOUNTER — Other Ambulatory Visit: Payer: Self-pay | Admitting: Obstetrics and Gynecology

## 2023-09-20 MED ORDER — NUVESSA 1.3 % VA GEL: VAGINAL | 0 refills | Status: DC

## 2023-09-29 ENCOUNTER — Ambulatory Visit: Admitting: Obstetrics & Gynecology

## 2023-10-11 ENCOUNTER — Ambulatory Visit
Admission: EM | Admit: 2023-10-11 | Discharge: 2023-10-11 | Disposition: A | Discharge status: Home / Self Care | Payer: MEDICAID | Attending: Family Medicine | Admitting: Family Medicine

## 2023-10-11 ENCOUNTER — Ambulatory Visit

## 2023-10-11 VITALS — BP 131/76 | HR 104 | Temp 98.3°F | Resp 16

## 2023-10-11 DIAGNOSIS — B9689 Other specified bacterial agents as the cause of diseases classified elsewhere: Secondary | ICD-10-CM

## 2023-10-11 DIAGNOSIS — N760 Acute vaginitis: Secondary | ICD-10-CM | POA: Diagnosis present

## 2023-10-11 LAB — POCT URINE PREGNANCY: Preg Test, Ur: NEGATIVE

## 2023-10-11 MED ORDER — METRONIDAZOLE 0.75 % VA GEL: 1.0000 | Freq: Two times a day (BID) | VAGINAL | 0 refills | Status: DC

## 2023-10-11 NOTE — ED Provider Notes (Signed)
 [facility]  Note:  This document was prepared using Conservation officer, historic buildings and may include unintentional dictation errors.  MRN: [id number] DOB: 04-06-2006  Subjective:   Mary Weber is a 18 y.o. female presenting for recurrent vaginal discharge similar to her previous BV infections. Has not been on contraception this year. Is currently at the end of her cycle.  No urinary symptoms.  No fever, nausea, vomiting, pelvic pain.  Has not had many yeast infections.  She has had a difficult time tolerating oral metronidazole.  Is not opposed to topical treatments.   No current facility-administered medications for this encounter.  Current Outpatient Medications:    metroNIDAZOLE (NUVESSA) 1.3 % GEL, Place one applicator in the vagina at bedtime, Disp: 5 g, Rfl: 0   levonorgestrel-ethinyl estradiol (VIENVA) 0.1-20 MG-MCG tablet, TAKE 1 TABLET BY MOUTH DAILY, Disp: 28 tablet, Rfl: 3   ondansetron (ZOFRAN-ODT) 4 MG disintegrating tablet, Take 1 tablet (4 mg total) by mouth every 6 (six) hours as needed for nausea. (Patient not taking: Reported on 09/12/2023), Disp: 60 tablet, Rfl: 3   pantoprazole (PROTONIX) 20 MG tablet, Take 1 tablet (20 mg total) by mouth daily. (Patient not taking: Reported on 09/12/2023), Disp: 30 tablet, Rfl: 1   Prenat-Fe Poly-Methfol-FA-DHA (VITAFOL FE+) 90-0.6-0.4-200 MG CAPS, Take 1 Capful by mouth daily. (Patient not taking: Reported on 03/30/2023), Disp: 30 capsule, Rfl: 10   Prenatal 28-0.8 MG TABS, Take 1 tablet by mouth daily. (Patient not taking: Reported on 09/12/2023), Disp: 30 tablet, Rfl: 12   promethazine (PHENERGAN) 25 MG tablet, Take 1 tablet (25 mg total) by mouth every 6 (six) hours as needed for nausea or vomiting. (Patient not taking: Reported on 09/12/2023), Disp: 30 tablet, Rfl: 0   scopolamine (TRANSDERM-SCOP) 1 MG/3DAYS, Place 1 patch (1.5 mg total) onto the skin every 3 (three) days. (Patient not taking: Reported on  09/12/2023), Disp: 10 patch, Rfl: 0   scopolamine (TRANSDERM-SCOP) 1 MG/3DAYS, Place 1 patch (1.5 mg total) onto the skin every 3 (three) days. (Patient not taking: Reported on 09/12/2023), Disp: 10 patch, Rfl: 12   Allergies  Allergen Reactions   Bee Pollen Itching    Pt reports pollen     Past Medical History:  Diagnosis Date   Chlamydia      Past Surgical History:  Procedure Laterality Date   NO PAST SURGERIES      Family History  Problem Relation Age of Onset   Healthy Mother    Healthy Father     Social History   Tobacco Use   Smoking status: Never   Smokeless tobacco: Never  Vaping Use   Vaping status: Never Used  Substance Use Topics   Alcohol use: Never   Drug use: Never    ROS   Objective:   Vitals: BP 131/76 (BP Location: Right Arm)   Pulse (!) 104   Temp 98.3 F (36.8 C) (Oral)   Resp 16   LMP 10/09/2023   SpO2 99%   Physical Exam Constitutional:      General: She is not in acute distress.    Appearance: Normal appearance. She is well-developed. She is not ill-appearing, toxic-appearing or diaphoretic.  HENT:     Head: Normocephalic and atraumatic.     Nose: Nose normal.     Mouth/Throat:     Mouth: Mucous membranes are moist.  Eyes:     General: No scleral icterus.       Right eye: No discharge.  Left eye: No discharge.     Extraocular Movements: Extraocular movements intact.     Conjunctiva/sclera: Conjunctivae normal.  Cardiovascular:     Rate and Rhythm: Normal rate.  Pulmonary:     Effort: Pulmonary effort is normal.  Abdominal:     General: Bowel sounds are normal. There is no distension.     Palpations: Abdomen is soft. There is no mass.     Tenderness: There is no abdominal tenderness. There is no right CVA tenderness, left CVA tenderness, guarding or rebound.  Skin:    General: Skin is warm and dry.  Neurological:     General: No focal deficit present.     Mental Status: She is alert and oriented to person, place, and  time.  Psychiatric:        Mood and Affect: Mood normal.        Behavior: Behavior normal.        Thought Content: Thought content normal.        Judgment: Judgment normal.     Results for orders placed or performed during the hospital encounter of 10/11/23 (from the past 24 hours)  POCT urine pregnancy     Status: None   Collection Time: 10/11/23  5:18 PM  Result Value Ref Range   Preg Test, Ur Negative Negative    Assessment and Plan :   PDMP not reviewed this encounter.  1. Bacterial vaginosis    Recommended management for recurrent bacterial vaginosis with metronidazole gel.  Labs pending, will treat as appropriate otherwise.  Follow-up with her OB/GYN for consultation regarding frequent and recurrent BV infections.  Counseled patient on potential for adverse effects with medications prescribed/recommended today, ER and return-to-clinic precautions discussed, patient verbalized understanding.    Wallis Bamberg, [place] 10/11/23 1731

## 2023-10-11 NOTE — Discharge Instructions (Addendum)
 We will let you know what your test results are tomorrow.  For now you can go ahead and start MetroGel for a suspected recurring bacterial vaginosis infection.  I do recommend following up with your gynecologist to see if you would benefit from a trial of boric acid suppositories as you have had difficulty with bouts of recurring bacterial vaginosis.

## 2023-10-11 NOTE — ED Triage Notes (Addendum)
 Pt c/o vaginal discharge s 3-4 days -states she was seen 3/13 and dx with BV -states for the rx pill "I would swallow some of it and it would come back up so I don't know if I got treated"

## 2023-10-12 ENCOUNTER — Telehealth (HOSPITAL_COMMUNITY): Payer: Self-pay

## 2023-10-12 LAB — CERVICOVAGINAL ANCILLARY ONLY
Bacterial Vaginitis (gardnerella): POSITIVE — AB
Candida Glabrata: NEGATIVE
Candida Vaginitis: NEGATIVE
Chlamydia: POSITIVE — AB
Comment: NEGATIVE
Comment: NEGATIVE
Comment: NEGATIVE
Comment: NEGATIVE
Comment: NEGATIVE
Comment: NORMAL
Neisseria Gonorrhea: NEGATIVE
Trichomonas: NEGATIVE

## 2023-10-12 MED ORDER — CLEOCIN 100 MG VA SUPP: 100.0000 mg | Freq: Every day | VAGINAL | 0 refills | Status: AC

## 2023-10-12 MED ORDER — DOXYCYCLINE HYCLATE 100 MG PO TABS
100.0000 mg | ORAL_TABLET | Freq: Two times a day (BID) | ORAL | 0 refills | Status: AC
Start: 2023-10-12 — End: 2023-10-19

## 2023-10-12 NOTE — Telephone Encounter (Signed)
 Per protocol, pt requires tx with Doxycycline. Pt was prescribed metrogel for BV, but she states it was not covered by her insurance. Requested alternative insert. Reviewed with patient, verified pharmacy, prescription sent

## 2023-12-13 ENCOUNTER — Inpatient Hospital Stay (HOSPITAL_COMMUNITY): Payer: MEDICAID

## 2023-12-13 ENCOUNTER — Encounter (HOSPITAL_COMMUNITY): Payer: Self-pay | Admitting: Obstetrics and Gynecology

## 2023-12-13 ENCOUNTER — Inpatient Hospital Stay (HOSPITAL_COMMUNITY)
Admission: AD | Admit: 2023-12-13 | Discharge: 2023-12-13 | Disposition: A | Discharge status: Home / Self Care | Payer: MEDICAID | Attending: Obstetrics and Gynecology | Admitting: Obstetrics and Gynecology

## 2023-12-13 ENCOUNTER — Telehealth: Payer: Self-pay

## 2023-12-13 DIAGNOSIS — O4691 Antepartum hemorrhage, unspecified, first trimester: Secondary | ICD-10-CM | POA: Diagnosis not present

## 2023-12-13 DIAGNOSIS — Z3A01 Less than 8 weeks gestation of pregnancy: Secondary | ICD-10-CM | POA: Diagnosis not present

## 2023-12-13 DIAGNOSIS — O209 Hemorrhage in early pregnancy, unspecified: Secondary | ICD-10-CM

## 2023-12-13 DIAGNOSIS — O26891 Other specified pregnancy related conditions, first trimester: Secondary | ICD-10-CM | POA: Diagnosis not present

## 2023-12-13 DIAGNOSIS — O3680X0 Pregnancy with inconclusive fetal viability, not applicable or unspecified: Secondary | ICD-10-CM

## 2023-12-13 DIAGNOSIS — R109 Unspecified abdominal pain: Secondary | ICD-10-CM | POA: Diagnosis present

## 2023-12-13 LAB — HCG, QUANTITATIVE, PREGNANCY: hCG, Beta Chain, Quant, S: 42 m[IU]/mL — ABNORMAL HIGH (ref <5)

## 2023-12-13 LAB — URINALYSIS, ROUTINE W REFLEX MICROSCOPIC
Glucose, UA: NEGATIVE mg/dL
Ketones, ur: 15 mg/dL — AB
Leukocytes,Ua: NEGATIVE
Nitrite: NEGATIVE
Protein, ur: 30 mg/dL — AB
Specific Gravity, Urine: >1.03 — ABNORMAL HIGH (ref 1.005–1.030)
pH: 6 (ref 5.0–8.0)

## 2023-12-13 LAB — CBC
HCT: 33.4 % — ABNORMAL LOW (ref 36.0–46.0)
Hemoglobin: 11 g/dL — ABNORMAL LOW (ref 12.0–15.0)
MCH: 27.6 pg (ref 26.0–34.0)
MCHC: 32.9 g/dL (ref 30.0–36.0)
MCV: 83.9 fL (ref 80.0–100.0)
Platelets: 163 10*3/uL (ref 150–400)
RBC: 3.98 MIL/uL (ref 3.87–5.11)
RDW: 13.3 % (ref 11.5–15.5)
WBC: 2.7 10*3/uL — ABNORMAL LOW (ref 4.0–10.5)
nRBC: 0 % (ref 0.0–0.2)

## 2023-12-13 LAB — WET PREP, GENITAL
Clue Cells Wet Prep HPF POC: NONE SEEN
Sperm: NONE SEEN
Trich, Wet Prep: NONE SEEN
WBC, Wet Prep HPF POC: <10 (ref <10)
Yeast Wet Prep HPF POC: NONE SEEN

## 2023-12-13 LAB — URINALYSIS, MICROSCOPIC (REFLEX)
Bacteria, UA: NONE SEEN
RBC / HPF: >50 RBC/hpf (ref 0–5)

## 2023-12-13 LAB — POCT PREGNANCY, URINE: Preg Test, Ur: POSITIVE — AB

## 2023-12-13 NOTE — MAU Provider Note (Signed)
 History     CSN: [id number]  Arrival date and time: 12/13/23 1057   Event Date/Time   First Provider Initiated Contact with Patient 12/13/23 1406      Chief Complaint  Patient presents with   Vaginal Bleeding   Abdominal Pain   Possible Pregnancy   HPI [name] is a 18 y.o. year old [id number] female at [redacted]w[redacted]d weeks gestation who presents to MAU reporting spotting yesterday morning.  She woke up super sweaty during the night.  She reports heavier flow in the middle of the night and having some cramping (light to moderate) she reports that it is more moderate now; 6/10 on pain scale.  She reports a positive home pregnancy test on 12/06/2023 but no confirmation at this time.  Her last menstrual period was 11/05/2023. She plans to receive [facility] with [facility]. Her partner is present and contributing to the history taking.   OB History     Gravida  4   Para  1   Term  1   Preterm      AB  2   Living  1      SAB      IAB  2   Ectopic      Multiple  0   Live Births  1        Obstetric Comments  AB- pills, no complication         Past Medical History:  Diagnosis Date   Chlamydia    Chlamydia     Past Surgical History:  Procedure Laterality Date   NO PAST SURGERIES      Family History  Problem Relation Age of Onset   Healthy Mother    Healthy Father     Social History   Tobacco Use   Smoking status: Never   Smokeless tobacco: Never  Vaping Use   Vaping status: Never Used  Substance Use Topics   Alcohol use: Never   Drug use: Never    Allergies:  Allergies  Allergen Reactions   Bee Pollen Itching    Pt reports pollen     Medications Prior to Admission  Medication Sig Dispense Refill Last Dose/Taking   levonorgestrel -ethinyl estradiol (VIENVA) 0.1-20 MG-MCG tablet TAKE 1 TABLET BY MOUTH DAILY 28 tablet 3    metroNIDAZOLE  (METROGEL ) 0.75 % vaginal gel Place 1 Applicatorful vaginally 2 (two) times daily. 140 g 0     ondansetron  (ZOFRAN -ODT) 4 MG disintegrating tablet Take 1 tablet (4 mg total) by mouth every 6 (six) hours as needed for nausea. (Patient not taking: Reported on 07/27/2023) 60 tablet 3 Not Taking   pantoprazole  (PROTONIX ) 20 MG tablet Take 1 tablet (20 mg total) by mouth daily. (Patient not taking: Reported on 07/27/2023) 30 tablet 1 Not Taking   Prenat-Fe Poly-Methfol-FA-DHA (VITAFOL  FE+) 90-0.6-0.4-200 MG CAPS Take 1 Capful by mouth daily. (Patient not taking: Reported on 03/30/2023) 30 capsule 10    Prenatal 28-0.8 MG TABS Take 1 tablet by mouth daily. (Patient not taking: Reported on 09/12/2023) 30 tablet 12    promethazine  (PHENERGAN ) 25 MG tablet Take 1 tablet (25 mg total) by mouth every 6 (six) hours as needed for nausea or vomiting. (Patient not taking: Reported on 09/12/2023) 30 tablet 0    scopolamine  (TRANSDERM-SCOP) 1 MG/3DAYS Place 1 patch (1.5 mg total) onto the skin every 3 (three) days. (Patient not taking: Reported on 09/12/2023) 10 patch 0    scopolamine  (TRANSDERM-SCOP) 1 MG/3DAYS Place 1 patch (  1.5 mg total) onto the skin every 3 (three) days. (Patient not taking: Reported on 09/12/2023) 10 patch 12     Review of Systems  Constitutional: Negative.   HENT: Negative.    Eyes: Negative.   Respiratory: Negative.    Cardiovascular: Negative.   Gastrointestinal: Negative.   Endocrine: Negative.   Genitourinary:  Positive for pelvic pain and vaginal bleeding.  Musculoskeletal: Negative.   Skin: Negative.   Allergic/Immunologic: Negative.   Neurological: Negative.   Hematological: Negative.   Psychiatric/Behavioral: Negative.     Physical Exam   Blood pressure 122/71, pulse (!) 106, temperature 98.4 F (36.9 C), temperature source Oral, resp. rate 16, height 4\' 11"  (1.499 m), weight 62.3 kg, last menstrual period 11/05/2023, SpO2 100%.  Physical Exam Vitals and nursing note reviewed.  Constitutional:      Appearance: Normal appearance. She is normal weight.  Cardiovascular:      Rate and Rhythm: Tachycardia present.  Genitourinary:    Comments: Swabs collected by patient using blind swab technique  Musculoskeletal:        General: Normal range of motion.  Skin:    General: Skin is warm and dry.  Neurological:     Mental Status: She is alert and oriented to person, place, and time.  Psychiatric:        Mood and Affect: Mood normal. Affect is tearful.        Behavior: Behavior normal.        Thought Content: Thought content normal.        Judgment: Judgment normal.     MAU Course  Procedures  MDM CCUA CBC Wet Prep GC/CT OB <14 wks U/S Recent Results (from the past 2160 hours)  Cervicovaginal ancillary only     Status: Abnormal   Collection Time: 10/11/23  4:53 PM  Result Value Ref Range   Neisseria Gonorrhea Negative    Chlamydia Positive (A)    Trichomonas Negative    Bacterial Vaginitis (gardnerella) Positive (A)    Candida Vaginitis Negative    Candida Glabrata Negative    Comment Normal Reference Ranger Chlamydia - Negative    Comment      Normal Reference Range Neisseria Gonorrhea - Negative   Comment      Normal Reference Range Bacterial Vaginosis - Negative   Comment Normal Reference Range Candida Species - Negative    Comment Normal Reference Range Candida Galbrata - Negative    Comment Normal Reference Range Trichomonas - Negative   POCT urine pregnancy     Status: None   Collection Time: 10/11/23  5:18 PM  Result Value Ref Range   Preg Test, Ur Negative Negative  GC/Chlamydia probe amp ([place])not at [facility]     Status: None   Collection Time: 12/13/23 11:22 AM  Result Value Ref Range   Neisseria Gonorrhea Negative    Chlamydia Negative    Comment Normal Reference Ranger Chlamydia - Negative    Comment      Normal Reference Range Neisseria Gonorrhea - Negative  Urinalysis, Routine w reflex microscopic -Urine, Clean Catch     Status: Abnormal   Collection Time: 12/13/23 11:28 AM  Result Value Ref Range   Color, Urine AMBER  (A) YELLOW    Comment: BIOCHEMICALS MAY BE AFFECTED BY COLOR   APPearance HAZY (A) CLEAR   Specific Gravity, Urine >1.030 (H) 1.005 - 1.030   pH 6.0 5.0 - 8.0   Glucose, UA NEGATIVE NEGATIVE mg/dL   Hgb urine dipstick LARGE (A)  NEGATIVE   Bilirubin Urine SMALL (A) NEGATIVE   Ketones, ur 15 (A) NEGATIVE mg/dL   Protein, ur 30 (A) NEGATIVE mg/dL   Nitrite NEGATIVE NEGATIVE   Leukocytes,Ua NEGATIVE NEGATIVE    Comment: Performed at [facility], [address] [place], [place] [address]  Wet prep, genital     Status: None   Collection Time: 12/13/23 11:28 AM  Result Value Ref Range   Yeast Wet Prep HPF POC NONE SEEN NONE SEEN   Trich, Wet Prep NONE SEEN NONE SEEN   Clue Cells Wet Prep HPF POC NONE SEEN NONE SEEN   WBC, Wet Prep HPF POC <10 <10   Sperm NONE SEEN     Comment: Performed at [facility], [address] [place], [place] [address]  Pregnancy, urine POC     Status: Abnormal   Collection Time: 12/13/23 11:28 AM  Result Value Ref Range   Preg Test, Ur POSITIVE (A) NEGATIVE    Comment:        THE SENSITIVITY OF THIS METHODOLOGY IS >24 mIU/mL   Urinalysis, Microscopic (reflex)     Status: None   Collection Time: 12/13/23 11:28 AM  Result Value Ref Range   RBC / HPF >50 0 - 5 RBC/hpf   WBC, UA 0-5 0 - 5 WBC/hpf   Bacteria, UA NONE SEEN NONE SEEN   Squamous Epithelial / HPF 0-5 0 - 5 /HPF   Mucus PRESENT     Comment: Performed at [facility], [address], [place], [place] [address]  CBC     Status: Abnormal   Collection Time: 12/13/23 11:50 AM  Result Value Ref Range   WBC 2.7 (L) 4.0 - 10.5 K/uL   RBC 3.98 3.87 - 5.11 MIL/uL   Hemoglobin 11.0 (L) 12.0 - 15.0 g/dL   HCT 91.4 (L) 78.2 - 95.6 %   MCV 83.9 80.0 - 100.0 fL   MCH 27.6 26.0 - 34.0 pg   MCHC 32.9 30.0 - 36.0 g/dL   RDW 21.3 08.6 - 57.8 %   Platelets 163 150 - 400 K/uL   nRBC 0.0 0.0 - 0.2 %    Comment: Performed at [facility], [address], [place], [place] [address]   hCG, quantitative, pregnancy     Status: Abnormal   Collection Time: 12/13/23 11:50 AM  Result Value Ref Range   hCG, Beta Chain, Quant, S 42 (H) <5 mIU/mL    Comment:          GEST. AGE      CONC.  (mIU/mL)   <=1 WEEK        5 - 50     2 WEEKS       50 - 500     3 WEEKS       100 - 10,000     4 WEEKS     1,000 - 30,000     5 WEEKS     3,500 - 115,000   6-8 WEEKS     12,000 - 270,000    12 WEEKS     15,000 - 220,000        FEMALE AND NON-PREGNANT FEMALE:     LESS THAN 5 mIU/mL Performed at [facility], [address], [place], [place] [address]                        Assessment and Plan  1. Pregnancy  of unknown anatomic location (Primary) - Will need repeat HCG in 48 hrs  2. Abdominal pain during pregnancy in first trimester - Information provided on abdominal pain in pregnancy   3. Vaginal bleeding in pregnancy, first trimester - Information provided on vaginal bleeding in pregnancy   4. [redacted] weeks gestation of pregnancy   - Discharge home  - Keep scheduled appt with [facility] on 12/15/2023 for rpt HCG - Patient verbalized an understanding of the plan of care and agrees.   Almond Army, CNM 12/13/2023, 2:06 PM

## 2023-12-13 NOTE — MAU Note (Signed)
 Mary Weber is a 18 y.o. at Unknown here in MAU reporting: saw spotting yesterday morning.  During the night, woke up twice, was super sweaty. When she went to the bathroom, she noted heavier flow and was having some cramping (light to moderate per picture), more moderate now.  +HPT 5/28, has not been confirmed yet. LMP: 4/27 Onset of complaint: yesterday Pain score: 6 Vitals:   12/13/23 1119  BP: 113/68  Pulse: 85  Resp: 16  Temp: 98.9 F (37.2 C)  SpO2: 100%      Lab orders placed from triage:  UA, UPT, vag swabs

## 2023-12-13 NOTE — Telephone Encounter (Signed)
 Patient called stating she is cramping and having heavy vaginal bleeding and soaking a pad. Advised patient to go to [facility] at [address]. Understanding was voiced. Breyon Sigg l Franz Svec, CMA

## 2023-12-13 NOTE — Discharge Instructions (Addendum)
 Your pregnancy has not been confirmed to be in your uterus. We will need to have you have blood work drawn again in 48 hours.  Safe Medications in Pregnancy   Acne: Benzoyl Peroxide Salicylic Acid  Backache/Headache: Tylenol : 2 regular strength every 4 hours OR              2 Extra strength every 6 hours  Colds/Coughs/Allergies: Benadryl  (alcohol free) 25 mg every 6 hours as needed Breath right strips Claritin Cepacol throat lozenges Chloraseptic throat spray Cold-Eeze- up to three times per day Cough drops, alcohol free Flonase (by prescription only) Guaifenesin Mucinex Robitussin DM (plain only, alcohol free) Saline nasal spray/drops Sudafed (pseudoephedrine) & Actifed ** use only after [redacted] weeks gestation and if you do not have high blood pressure Tylenol  Vicks Vaporub Zinc lozenges Zyrtec   Constipation: Colace Ducolax suppositories Fleet enema Glycerin  suppositories Metamucil Milk of magnesia Miralax Senokot Smooth move tea  Diarrhea: Kaopectate Imodium A-D  *NO pepto Bismol  Hemorrhoids: Anusol Anusol HC Preparation H Tucks  Indigestion: Tums Maalox Mylanta Zantac  Pepcid   Insomnia: Benadryl  (alcohol free) 25mg  every 6 hours as needed Tylenol  PM Unisom, no Gelcaps  Leg Cramps: Tums MagGel  Nausea/Vomiting:  Bonine Dramamine Emetrol Ginger extract Sea bands Meclizine  Nausea medication to take during pregnancy:  Unisom (doxylamine succinate 25 mg tablets) Take one tablet daily at bedtime. If symptoms are not adequately controlled, the dose can be increased to a maximum recommended dose of two tablets daily (1/2 tablet in the morning, 1/2 tablet mid-afternoon and one at bedtime). Vitamin B6 100mg  tablets. Take one tablet twice a day (up to 200 mg per day).  Skin Rashes: Aveeno products Benadryl  cream or 25mg  every 6 hours as needed Calamine Lotion 1% cortisone cream  Yeast infection: Gyne-lotrimin 7 Monistat 7   **If  taking multiple medications, please check labels to avoid duplicating the same active ingredients **take medication as directed on the label ** Do not exceed 4000 mg of tylenol  in 24 hours **Do not take medications that contain aspirin or ibuprofen 

## 2023-12-14 LAB — GC/CHLAMYDIA PROBE AMP (~~LOC~~) NOT AT ARMC
Chlamydia: NEGATIVE
Comment: NEGATIVE
Comment: NORMAL
Neisseria Gonorrhea: NEGATIVE

## 2023-12-15 ENCOUNTER — Other Ambulatory Visit (INDEPENDENT_AMBULATORY_CARE_PROVIDER_SITE_OTHER): Payer: Self-pay

## 2023-12-15 VITALS — BP 115/68 | HR 88 | Wt 137.1 lb

## 2023-12-15 DIAGNOSIS — Z3A01 Less than 8 weeks gestation of pregnancy: Secondary | ICD-10-CM

## 2023-12-15 DIAGNOSIS — O3680X0 Pregnancy with inconclusive fetal viability, not applicable or unspecified: Secondary | ICD-10-CM

## 2023-12-15 LAB — BETA HCG QUANT (REF LAB): hCG Quant: 7 m[IU]/mL

## 2023-12-15 NOTE — Progress Notes (Signed)
 Patient presents for STAT HCG. Patient states she is having lower abdominal pain and bleeding.Advised patient to return to MAU if she starts to bleed heavy and if pain becomes unbearable. Patient was sent to the lab to have lab drawn. Salley Boxley l Owyn Raulston, CMA

## 2023-12-17 ENCOUNTER — Telehealth: Payer: Self-pay | Admitting: Obstetrics and Gynecology

## 2023-12-17 NOTE — Telephone Encounter (Signed)
 TC to discuss HCG results and the dx of complete SAB. Explained to her that the HCG dropping from 42 to 7 is a definitive definition of complete SAB. Advised she will not need to have another HCG redrawn in 1 week, but will need to see a provider in the office next week. Will send a message to the office to cancel upcoming RN visit on 12/26/2023. Patient verbalized an understanding of the plan of care and agrees.   Mary Weber, CNM

## 2023-12-19 ENCOUNTER — Ambulatory Visit: Payer: Self-pay | Admitting: Family Medicine

## 2023-12-26 ENCOUNTER — Encounter

## 2024-01-01 ENCOUNTER — Ambulatory Visit: Admitting: Obstetrics and Gynecology

## 2024-01-19 ENCOUNTER — Ambulatory Visit (INDEPENDENT_AMBULATORY_CARE_PROVIDER_SITE_OTHER): Admitting: Obstetrics and Gynecology

## 2024-01-19 ENCOUNTER — Encounter: Payer: Self-pay | Admitting: Obstetrics and Gynecology

## 2024-01-19 VITALS — BP 117/74 | HR 104 | Ht 59.0 in | Wt 138.0 lb

## 2024-01-19 DIAGNOSIS — O039 Complete or unspecified spontaneous abortion without complication: Secondary | ICD-10-CM | POA: Diagnosis not present

## 2024-01-19 DIAGNOSIS — Z308 Encounter for other contraceptive management: Secondary | ICD-10-CM | POA: Diagnosis not present

## 2024-01-19 DIAGNOSIS — Z5189 Encounter for other specified aftercare: Secondary | ICD-10-CM

## 2024-01-19 MED ORDER — LEVONORGESTREL-ETHINYL ESTRAD 0.1-20 MG-MCG PO TABS: 1.0000 | ORAL_TABLET | Freq: Every day | ORAL | 11 refills | Status: DC

## 2024-01-19 NOTE — Progress Notes (Signed)
   ESTABLISHED GYNECOLOGY VISIT Chief Complaint  Patient presents with   Miscarriage    Follow up for SAB     Subjective:  Mary Weber is a 18 y.o. [id number] presenting for miscarriage follow up.  She unfortunately had a miscarriage on 12/13/23. HCG 42--> 7. She reports that she has since had a period on 7/4. She denies unprotected sex since then. She reports she feels well and denies pelvic pain. Interested in re-starting birth control pills   Review of Systems:   Pertinent items are noted in HPI  Pertinent History Reviewed:  Reviewed past medical,surgical, social and family history.  Reviewed problem list, medications and allergies.  Objective:   Vitals:   01/19/24 0858  BP: 117/74  Pulse: (!) 104  Weight: 138 lb (62.6 kg)  Height: 4' 11 (1.499 m)   Physical Examination:   General appearance - well appearing, and in no distress  Mental status - alert, oriented to person, place, and time  Psych:  normal mood and affect  Skin - warm and dry, normal color, no suspicious lesions noted  Abdomen - soft, nontender, nondistended, no masses or organomegaly  Pelvic - deferred     Assessment and Plan:  1. Follow-up visit after miscarriage (Primary) Doing well, appropriate recovery. HCG downtrended appropriately. Mood appropriate. Menses has returned.  2. Encounter for other contraceptive management Restart Ocps. Rx given. Denies history of hypertension, venous thromboembolism, migraines with aura, liver disease   - levonorgestrel -ethinyl estradiol (ALESSE) 0.1-20 MG-MCG tablet; Take 1 tablet by mouth daily.  Dispense: 28 tablet; Refill: 11   Rollo ONEIDA Bring, MD, FACOG Obstetrician & Gynecologist, [facility] for [facility], [facility]

## 2024-02-26 ENCOUNTER — Inpatient Hospital Stay (HOSPITAL_COMMUNITY)
Admission: AD | Admit: 2024-02-26 | Discharge: 2024-02-27 | Disposition: A | Discharge status: Home / Self Care | Attending: Obstetrics and Gynecology | Admitting: Obstetrics and Gynecology

## 2024-02-26 ENCOUNTER — Encounter (HOSPITAL_COMMUNITY): Payer: Self-pay | Admitting: Obstetrics and Gynecology

## 2024-02-26 DIAGNOSIS — B9689 Other specified bacterial agents as the cause of diseases classified elsewhere: Secondary | ICD-10-CM | POA: Insufficient documentation

## 2024-02-26 DIAGNOSIS — O23592 Infection of other part of genital tract in pregnancy, second trimester: Secondary | ICD-10-CM | POA: Insufficient documentation

## 2024-02-26 DIAGNOSIS — O26892 Other specified pregnancy related conditions, second trimester: Secondary | ICD-10-CM | POA: Insufficient documentation

## 2024-02-26 DIAGNOSIS — Z3A01 Less than 8 weeks gestation of pregnancy: Secondary | ICD-10-CM

## 2024-02-26 DIAGNOSIS — Z3A20 20 weeks gestation of pregnancy: Secondary | ICD-10-CM | POA: Insufficient documentation

## 2024-02-26 DIAGNOSIS — O219 Vomiting of pregnancy, unspecified: Secondary | ICD-10-CM | POA: Insufficient documentation

## 2024-02-26 NOTE — MAU Note (Signed)
 Pt says in June 2025 had a SAB- so is not 16.1 weeks .  LMP was 01-12-2024- got a positive UPT  at home on 02-13-2024. Feels occ cramping. - no pain now  No vomiting , but spitting , feels nausea .

## 2024-02-27 ENCOUNTER — Ambulatory Visit: Payer: Self-pay | Admitting: Obstetrics and Gynecology

## 2024-02-27 ENCOUNTER — Inpatient Hospital Stay (HOSPITAL_COMMUNITY)

## 2024-02-27 ENCOUNTER — Encounter (HOSPITAL_COMMUNITY): Payer: Self-pay | Admitting: *Deleted

## 2024-02-27 DIAGNOSIS — Z3A01 Less than 8 weeks gestation of pregnancy: Secondary | ICD-10-CM | POA: Diagnosis not present

## 2024-02-27 DIAGNOSIS — O219 Vomiting of pregnancy, unspecified: Secondary | ICD-10-CM | POA: Diagnosis not present

## 2024-02-27 DIAGNOSIS — O23592 Infection of other part of genital tract in pregnancy, second trimester: Secondary | ICD-10-CM | POA: Diagnosis not present

## 2024-02-27 DIAGNOSIS — O26891 Other specified pregnancy related conditions, first trimester: Secondary | ICD-10-CM | POA: Diagnosis present

## 2024-02-27 DIAGNOSIS — B9689 Other specified bacterial agents as the cause of diseases classified elsewhere: Secondary | ICD-10-CM | POA: Diagnosis not present

## 2024-02-27 DIAGNOSIS — Z3A20 20 weeks gestation of pregnancy: Secondary | ICD-10-CM | POA: Diagnosis not present

## 2024-02-27 DIAGNOSIS — O23591 Infection of other part of genital tract in pregnancy, first trimester: Secondary | ICD-10-CM | POA: Diagnosis present

## 2024-02-27 DIAGNOSIS — O26892 Other specified pregnancy related conditions, second trimester: Secondary | ICD-10-CM | POA: Diagnosis not present

## 2024-02-27 LAB — CBC
HCT: 33.9 % — ABNORMAL LOW (ref 36.0–46.0)
Hemoglobin: 11.4 g/dL — ABNORMAL LOW (ref 12.0–15.0)
MCH: 27.9 pg (ref 26.0–34.0)
MCHC: 33.6 g/dL (ref 30.0–36.0)
MCV: 83.1 fL (ref 80.0–100.0)
Platelets: 233 K/uL (ref 150–400)
RBC: 4.08 MIL/uL (ref 3.87–5.11)
RDW: 12.5 % (ref 11.5–15.5)
WBC: 7.6 K/uL (ref 4.0–10.5)
nRBC: 0 % (ref 0.0–0.2)

## 2024-02-27 LAB — COMPREHENSIVE METABOLIC PANEL WITH GFR
ALT: 10 U/L (ref 0–44)
AST: 16 U/L (ref 15–41)
Albumin: 3.9 g/dL (ref 3.5–5.0)
Alkaline Phosphatase: 34 U/L — ABNORMAL LOW (ref 38–126)
Anion gap: 10 (ref 5–15)
BUN: 6 mg/dL (ref 6–20)
CO2: 22 mmol/L (ref 22–32)
Calcium: 10.6 mg/dL — ABNORMAL HIGH (ref 8.9–10.3)
Chloride: 106 mmol/L (ref 98–111)
Creatinine, Ser: 0.55 mg/dL (ref 0.44–1.00)
GFR, Estimated: >60 mL/min (ref >60)
Glucose, Bld: 98 mg/dL (ref 70–99)
Potassium: 3.5 mmol/L (ref 3.5–5.1)
Sodium: 138 mmol/L (ref 135–145)
Total Bilirubin: 0.8 mg/dL (ref 0.0–1.2)
Total Protein: 7.1 g/dL (ref 6.5–8.1)

## 2024-02-27 LAB — POCT PREGNANCY, URINE: Preg Test, Ur: POSITIVE — AB

## 2024-02-27 LAB — WET PREP, GENITAL
Sperm: NONE SEEN
Trich, Wet Prep: NONE SEEN
WBC, Wet Prep HPF POC: >=10 — AB (ref <10)
Yeast Wet Prep HPF POC: NONE SEEN

## 2024-02-27 LAB — URINALYSIS, ROUTINE W REFLEX MICROSCOPIC
Bilirubin Urine: NEGATIVE
Glucose, UA: NEGATIVE mg/dL
Hgb urine dipstick: NEGATIVE
Ketones, ur: 20 mg/dL — AB
Nitrite: NEGATIVE
Protein, ur: 30 mg/dL — AB
Specific Gravity, Urine: 1.029 (ref 1.005–1.030)
pH: 5 (ref 5.0–8.0)

## 2024-02-27 LAB — ABO/RH: ABO/RH(D): O POS

## 2024-02-27 LAB — GC/CHLAMYDIA PROBE AMP (~~LOC~~) NOT AT ARMC
Chlamydia: NEGATIVE
Comment: NEGATIVE
Comment: NORMAL
Neisseria Gonorrhea: NEGATIVE

## 2024-02-27 LAB — HCG, QUANTITATIVE, PREGNANCY: hCG, Beta Chain, Quant, S: 104608 m[IU]/mL — ABNORMAL HIGH (ref <5)

## 2024-02-27 MED ORDER — ONDANSETRON 4 MG PO TBDP: 4.0000 mg | ORAL_TABLET | Freq: Three times a day (TID) | ORAL | 1 refills | Status: AC | PRN

## 2024-02-27 MED ORDER — GLYCOPYRROLATE 1 MG PO TABS: 2.0000 mg | ORAL_TABLET | Freq: Three times a day (TID) | ORAL | 0 refills | Status: AC | PRN

## 2024-02-27 MED ORDER — METRONIDAZOLE 0.75 % VA GEL: 1.0000 | Freq: Every day | VAGINAL | 5 refills | Status: DC

## 2024-02-27 MED ORDER — GLYCOPYRROLATE 1 MG PO TABS
2.0000 mg | ORAL_TABLET | Freq: Three times a day (TID) | ORAL | Status: DC | PRN
  Administered 2024-02-27: 2 mg via ORAL
  Filled 2024-02-27: qty 2

## 2024-02-27 MED ORDER — METRONIDAZOLE 0.75 % VA GEL: 1.0000 | Freq: Every day | VAGINAL | 0 refills | Status: AC

## 2024-02-27 MED ORDER — ONDANSETRON 4 MG PO TBDP
4.0000 mg | ORAL_TABLET | Freq: Once | ORAL | Status: AC
  Administered 2024-02-27: 4 mg via ORAL
  Filled 2024-02-27: qty 1

## 2024-02-27 MED ORDER — SCOPOLAMINE 1 MG/3DAYS TD PT72: 1.0000 | MEDICATED_PATCH | TRANSDERMAL | 12 refills | Status: AC

## 2024-02-27 MED ORDER — SCOPOLAMINE 1 MG/3DAYS TD PT72
1.0000 | MEDICATED_PATCH | TRANSDERMAL | Status: DC
  Administered 2024-02-27: 1.5 mg via TRANSDERMAL
  Filled 2024-02-27: qty 1

## 2024-02-27 NOTE — MAU Provider Note (Signed)
 Chief Complaint: No chief complaint on file.  SUBJECTIVE HPI: Mary Weber is a 18 y.o. [phone] at [redacted]w[redacted]d by LMP who presents to maternity admissions reporting nausea and spitting. No emesis. Intermittent cramping today but none now.  She denies vaginal bleeding, vaginal itching/burning, urinary symptoms, h/a, dizziness, n/v, or fever/chills.    HPI  Past Medical History:  Diagnosis Date   Chlamydia    Chlamydia    Past Surgical History:  Procedure Laterality Date   NO PAST SURGERIES     Social History   Socioeconomic History   Marital status: Single    Spouse name: Not on file   Number of children: Not on file   Years of education: Not on file   Highest education level: Not on file  Occupational History   Not on file  Tobacco Use   Smoking status: Never   Smokeless tobacco: Never  Vaping Use   Vaping status: Never Used  Substance and Sexual Activity   Alcohol use: Never   Drug use: Never   Sexual activity: Yes    Birth control/protection: None  Other Topics Concern   Not on file  Social History Narrative   Not on file   Social Drivers of Health   Financial Resource Strain: Not on file  Food Insecurity: Low Risk  (02/13/2023)   Received from [facility]   Hunger Vital Sign    Within the past 12 months, you worried that your food would run out before you got money to buy more: Never true    Within the past 12 months, the food you bought just didn't last and you didn't have money to get more. : Never true  Transportation Needs: Not on file (02/13/2023)  Physical Activity: Not on file  Stress: Not on file  Social Connections: Not on file  Intimate Partner Violence: Not on file   No current facility-administered medications on file prior to encounter.   Current Outpatient Medications on File Prior to Encounter  Medication Sig Dispense Refill   levonorgestrel -ethinyl estradiol (ALESSE) 0.1-20 MG-MCG tablet Take 1 tablet by mouth daily. 28 tablet 11   Prenatal  28-0.8 MG TABS Take 1 tablet by mouth daily. (Patient not taking: Reported on 01/19/2024) 30 tablet 12   promethazine  (PHENERGAN ) 25 MG tablet Take 1 tablet (25 mg total) by mouth every 6 (six) hours as needed for nausea or vomiting. (Patient not taking: Reported on 01/19/2024) 30 tablet 0   scopolamine  (TRANSDERM-SCOP) 1 MG/3DAYS Place 1 patch (1.5 mg total) onto the skin every 3 (three) days. (Patient not taking: Reported on 01/19/2024) 10 patch 0   scopolamine  (TRANSDERM-SCOP) 1 MG/3DAYS Place 1 patch (1.5 mg total) onto the skin every 3 (three) days. (Patient not taking: Reported on 01/19/2024) 10 patch 12   Allergies  Allergen Reactions   Bee Pollen Itching    Pt reports pollen     ROS:  Pertinent positives/negatives listed above.  I have reviewed patient's Past Medical Hx, Surgical Hx, Family Hx, Social Hx, medications and allergies.   Physical Exam  Patient Vitals for the past 24 hrs:  BP Temp Temp src Pulse Resp Height Weight  02/27/24 0004 109/61 98.9 F (37.2 C) Oral 85 12 4' 11 (1.499 m) 60.6 kg   Constitutional: Well-developed, well-nourished female in no acute distress.  Cardiovascular: normal rate Respiratory: normal effort GI: Abd soft, non-tender. Pos BS x 4 MS: Extremities nontender, no edema, normal ROM Neurologic: Alert and oriented x 4.  GU: Neg CVAT.  US : 117  LAB RESULTS Results for orders placed or performed during the hospital encounter of 02/26/24 (from the past 24 hours)  Urinalysis, Routine w reflex microscopic -Urine, Clean Catch     Status: Abnormal   Collection Time: 02/27/24 12:07 AM  Result Value Ref Range   Color, Urine AMBER (A) YELLOW   APPearance CLOUDY (A) CLEAR   Specific Gravity, Urine 1.029 1.005 - 1.030   pH 5.0 5.0 - 8.0   Glucose, UA NEGATIVE NEGATIVE mg/dL   Hgb urine dipstick NEGATIVE NEGATIVE   Bilirubin Urine NEGATIVE NEGATIVE   Ketones, ur 20 (A) NEGATIVE mg/dL   Protein, ur 30 (A) NEGATIVE mg/dL   Nitrite NEGATIVE NEGATIVE    Leukocytes,Ua MODERATE (A) NEGATIVE   RBC / HPF 0-5 0 - 5 RBC/hpf   WBC, UA 0-5 0 - 5 WBC/hpf   Bacteria, UA RARE (A) NONE SEEN   Squamous Epithelial / HPF 21-50 0 - 5 /HPF   Mucus PRESENT   Pregnancy, urine POC     Status: Abnormal   Collection Time: 02/27/24 12:19 AM  Result Value Ref Range   Preg Test, Ur POSITIVE (A) NEGATIVE  Wet prep, genital     Status: Abnormal   Collection Time: 02/27/24 12:20 AM  Result Value Ref Range   Yeast Wet Prep HPF POC NONE SEEN NONE SEEN   Trich, Wet Prep NONE SEEN NONE SEEN   Clue Cells Wet Prep HPF POC PRESENT (A) NONE SEEN   WBC, Wet Prep HPF POC >=10 (A) <10   Sperm NONE SEEN   CBC     Status: Abnormal   Collection Time: 02/27/24  1:02 AM  Result Value Ref Range   WBC 7.6 4.0 - 10.5 K/uL   RBC 4.08 3.87 - 5.11 MIL/uL   Hemoglobin 11.4 (L) 12.0 - 15.0 g/dL   HCT 66.0 (L) 63.9 - 53.9 %   MCV 83.1 80.0 - 100.0 fL   MCH 27.9 26.0 - 34.0 pg   MCHC 33.6 30.0 - 36.0 g/dL   RDW 87.4 88.4 - 84.4 %   Platelets 233 150 - 400 K/uL   nRBC 0.0 0.0 - 0.2 %  ABO/Rh     Status: None   Collection Time: 02/27/24  1:02 AM  Result Value Ref Range   ABO/RH(D) O POS    No rh immune globuloin      NOT A RH IMMUNE GLOBULIN CANDIDATE, PT RH POSITIVE Performed at [facility], [address], [place], [place] [address]     --/--/O POS (08/19 0102)  IMAGING No results found.  MAU Management/MDM: Orders Placed This Encounter  Procedures   Wet prep, genital   US  OB LESS THAN 14 WEEKS WITH OB TRANSVAGINAL   Urinalysis, Routine w reflex microscopic -Urine, Clean Catch   CBC   Comprehensive metabolic panel with GFR   hCG, quantitative, pregnancy   Pregnancy, urine POC   ABO/Rh   Discharge patient Discharge disposition: 01-Home or Self Care; Discharge patient date: 02/27/2024    Meds ordered this encounter  Medications   glycopyrrolate  (ROBINUL ) tablet 2 mg   ondansetron  (ZOFRAN -ODT) disintegrating tablet 4 mg   scopolamine  (TRANSDERM-SCOP) 1  MG/3DAYS 1.5 mg   DISCONTD: metroNIDAZOLE  (METROGEL ) 0.75 % vaginal gel    Sig: Place 1 Applicatorful vaginally at bedtime. Apply one applicatorful to vagina at bedtime for 10 days, then twice a week for 6 months.    Dispense:  70 g    Refill:  5  scopolamine  (TRANSDERM-SCOP) 1 MG/3DAYS    Sig: Place 1 patch (1.5 mg total) onto the skin every 3 (three) days.    Dispense:  10 patch    Refill:  12   glycopyrrolate  (ROBINUL ) 1 MG tablet    Sig: Take 2 tablets (2 mg total) by mouth 3 (three) times daily as needed for up to 20 doses (spitting).    Dispense:  20 tablet    Refill:  0   ondansetron  (ZOFRAN -ODT) 4 MG disintegrating tablet    Sig: Take 1 tablet (4 mg total) by mouth every 8 (eight) hours as needed for up to 20 doses for nausea or vomiting.    Dispense:  20 tablet    Refill:  1   metroNIDAZOLE  (METROGEL ) 0.75 % vaginal gel    Sig: Place 1 Applicatorful vaginally at bedtime for 5 days.    Dispense:  50 g    Refill:  0    ASSESSMENT 1. [redacted] weeks gestation of pregnancy   2. Bacterial vaginosis   3. Nausea/vomiting in pregnancy    BV - treat with 5 days of Metrogel   SIUP with FHR 117 on my review of US . No evidence of ectopic. Normal ovaries.   N/V - zofran , scopolamine  and Robinul  with good results  PLAN Discharge home with strict return precautions. Allergies as of 02/27/2024       Reactions   Bee Pollen Itching   Pt reports pollen         Medication List     STOP taking these medications    levonorgestrel -ethinyl estradiol 0.1-20 MG-MCG tablet Commonly known as: ALESSE       TAKE these medications    glycopyrrolate  1 MG tablet Commonly known as: ROBINUL  Take 2 tablets (2 mg total) by mouth 3 (three) times daily as needed for up to 20 doses (spitting).   metroNIDAZOLE  0.75 % vaginal gel Commonly known as: METROGEL  Place 1 Applicatorful vaginally at bedtime for 5 days.   ondansetron  4 MG disintegrating tablet Commonly known as: ZOFRAN -ODT Take 1  tablet (4 mg total) by mouth every 8 (eight) hours as needed for up to 20 doses for nausea or vomiting.   Prenatal 28-0.8 MG Tabs Take 1 tablet by mouth daily.   promethazine  25 MG tablet Commonly known as: PHENERGAN  Take 1 tablet (25 mg total) by mouth every 6 (six) hours as needed for nausea or vomiting.   Transderm-Scop 1 MG/3DAYS Generic drug: scopolamine  Place 1 patch (1.5 mg total) onto the skin every 3 (three) days.   scopolamine  1 MG/3DAYS Commonly known as: TRANSDERM-SCOP Place 1 patch (1.5 mg total) onto the skin every 3 (three) days.        Mardy Shropshire, MD FMOB Fellow, Faculty practice [facility], Center for [facility]  02/27/2024  1:39 AM

## 2024-02-27 NOTE — Discharge Instructions (Signed)

## 2024-02-27 NOTE — MAU Provider Note (Signed)
 Chief Complaint: Nausea  SUBJECTIVE HPI: Mary Weber is a 18 y.o. H4E8968 at [redacted]w[redacted]d by LMP who presents to maternity admissions reporting positive pregnancy test at home 02/13/2024. Hx of SAB 12/2023. Feeling occasional cramping since pregnancy test but none now. Denies emesis. Feels nauseous and has lots of spitting.  She denies vaginal bleeding, vaginal itching/burning, urinary symptoms, h/a, dizziness, n/v, or fever/chills.    HPI  Past Medical History:  Diagnosis Date   Chlamydia    Chlamydia    Past Surgical History:  Procedure Laterality Date   NO PAST SURGERIES     Social History   Socioeconomic History   Marital status: Single    Spouse name: Not on file   Number of children: Not on file   Years of education: Not on file   Highest education level: Not on file  Occupational History   Not on file  Tobacco Use   Smoking status: Never   Smokeless tobacco: Never  Vaping Use   Vaping status: Never Used  Substance and Sexual Activity   Alcohol use: Never   Drug use: Never   Sexual activity: Yes    Birth control/protection: None  Other Topics Concern   Not on file  Social History Narrative   Not on file   Social Drivers of Health   Financial Resource Strain: Not on file  Food Insecurity: Low Risk  (02/13/2023)   Received from [facility]   Hunger Vital Sign    Within the past 12 months, you worried that your food would run out before you got money to buy more: Never true    Within the past 12 months, the food you bought just didn't last and you didn't have money to get more. : Never true  Transportation Needs: Not on file (02/13/2023)  Physical Activity: Not on file  Stress: Not on file  Social Connections: Not on file  Intimate Partner Violence: Not on file   No current facility-administered medications on file prior to encounter.   Current Outpatient Medications on File Prior to Encounter  Medication Sig Dispense Refill   Prenatal 28-0.8 MG TABS Take 1  tablet by mouth daily. (Patient not taking: Reported on 01/19/2024) 30 tablet 12   promethazine  (PHENERGAN ) 25 MG tablet Take 1 tablet (25 mg total) by mouth every 6 (six) hours as needed for nausea or vomiting. (Patient not taking: Reported on 01/19/2024) 30 tablet 0   scopolamine  (TRANSDERM-SCOP) 1 MG/3DAYS Place 1 patch (1.5 mg total) onto the skin every 3 (three) days. (Patient not taking: Reported on 01/19/2024) 10 patch 0   Allergies  Allergen Reactions   Bee Pollen Itching    Pt reports pollen     ROS:  Pertinent positives/negatives listed above.  I have reviewed patient's Past Medical Hx, Surgical Hx, Family Hx, Social Hx, medications and allergies.   Physical Exam  Patient Vitals for the past 24 hrs:  BP Temp Temp src Pulse Resp Height Weight  02/27/24 0155 -- -- -- -- 12 -- --  02/27/24 0004 109/61 98.9 F (37.2 C) Oral 85 12 4' 11 (1.499 m) 60.6 kg   Constitutional: Well-developed, well-nourished female in no acute distress.  Cardiovascular: normal rate Respiratory: normal effort GI: Abd soft, non-tender. Pos BS x 4 MS: Extremities nontender, no edema, normal ROM Neurologic: Alert and oriented x 4.  GU: Neg CVAT.   LAB RESULTS Results for orders placed or performed during the hospital encounter of 02/26/24 (from the past 24 hours)  Urinalysis, Routine w reflex microscopic -Urine, Clean Catch     Status: Abnormal   Collection Time: 02/27/24 12:07 AM  Result Value Ref Range   Color, Urine AMBER (A) YELLOW   APPearance CLOUDY (A) CLEAR   Specific Gravity, Urine 1.029 1.005 - 1.030   pH 5.0 5.0 - 8.0   Glucose, UA NEGATIVE NEGATIVE mg/dL   Hgb urine dipstick NEGATIVE NEGATIVE   Bilirubin Urine NEGATIVE NEGATIVE   Ketones, ur 20 (A) NEGATIVE mg/dL   Protein, ur 30 (A) NEGATIVE mg/dL   Nitrite NEGATIVE NEGATIVE   Leukocytes,Ua MODERATE (A) NEGATIVE   RBC / HPF 0-5 0 - 5 RBC/hpf   WBC, UA 0-5 0 - 5 WBC/hpf   Bacteria, UA RARE (A) NONE SEEN   Squamous Epithelial /  HPF 21-50 0 - 5 /HPF   Mucus PRESENT   Pregnancy, urine POC     Status: Abnormal   Collection Time: 02/27/24 12:19 AM  Result Value Ref Range   Preg Test, Ur POSITIVE (A) NEGATIVE  Wet prep, genital     Status: Abnormal   Collection Time: 02/27/24 12:20 AM  Result Value Ref Range   Yeast Wet Prep HPF POC NONE SEEN NONE SEEN   Trich, Wet Prep NONE SEEN NONE SEEN   Clue Cells Wet Prep HPF POC PRESENT (A) NONE SEEN   WBC, Wet Prep HPF POC >=10 (A) <10   Sperm NONE SEEN   CBC     Status: Abnormal   Collection Time: 02/27/24  1:02 AM  Result Value Ref Range   WBC 7.6 4.0 - 10.5 K/uL   RBC 4.08 3.87 - 5.11 MIL/uL   Hemoglobin 11.4 (L) 12.0 - 15.0 g/dL   HCT 66.0 (L) 63.9 - 53.9 %   MCV 83.1 80.0 - 100.0 fL   MCH 27.9 26.0 - 34.0 pg   MCHC 33.6 30.0 - 36.0 g/dL   RDW 87.4 88.4 - 84.4 %   Platelets 233 150 - 400 K/uL   nRBC 0.0 0.0 - 0.2 %  Comprehensive metabolic panel with GFR     Status: Abnormal   Collection Time: 02/27/24  1:02 AM  Result Value Ref Range   Sodium 138 135 - 145 mmol/L   Potassium 3.5 3.5 - 5.1 mmol/L   Chloride 106 98 - 111 mmol/L   CO2 22 22 - 32 mmol/L   Glucose, Bld 98 70 - 99 mg/dL   BUN 6 6 - 20 mg/dL   Creatinine, Ser 9.44 0.44 - 1.00 mg/dL   Calcium 89.3 (H) 8.9 - 10.3 mg/dL   Total Protein 7.1 6.5 - 8.1 g/dL   Albumin 3.9 3.5 - 5.0 g/dL   AST 16 15 - 41 U/L   ALT 10 0 - 44 U/L   Alkaline Phosphatase 34 (L) 38 - 126 U/L   Total Bilirubin 0.8 0.0 - 1.2 mg/dL   GFR, Estimated >39 >39 mL/min   Anion gap 10 5 - 15  hCG, quantitative, pregnancy     Status: Abnormal   Collection Time: 02/27/24  1:02 AM  Result Value Ref Range   hCG, Beta Chain, Quant, S 104,608 (H) <5 mIU/mL  ABO/Rh     Status: None   Collection Time: 02/27/24  1:02 AM  Result Value Ref Range   ABO/RH(D) O POS    No rh immune globuloin      NOT A RH IMMUNE GLOBULIN CANDIDATE, PT RH POSITIVE Performed at [facility], [address], [place],  [place] [address]     --/--/O  POS (08/19 0102)  IMAGING US  OB LESS THAN 14 WEEKS WITH OB TRANSVAGINAL Result Date: 02/27/2024 CLINICAL DATA:  Pregnancy of unknown location. EXAM: OBSTETRIC <14 WK US  AND TRANSVAGINAL OB US  TECHNIQUE: Both transabdominal and transvaginal ultrasound examinations were performed for complete evaluation of the gestation as well as the maternal uterus, adnexal regions, and pelvic cul-de-sac. Transvaginal technique was performed to assess early pregnancy. COMPARISON:  None with this pregnancy. FINDINGS: Intrauterine gestational sac:  Single. Yolk sac:  Visualized, measures 5 mm. Embryo:  Visualized. Cardiac Activity: Visualized. Heart Rate: 117 bpm. CRL:  5.1 mm   6 w   1 d +/-4 days; US  EDC:  10/21/2024 Subchorionic hemorrhage:  None visualized. Maternal uterus/adnexae: Unremarkable ovaries and adnexal spaces. No free fluid is seen. The uterus is anteverted and measures 10.4 x 6.0 x 5.6 cm. No wall mass is seen. The cervix is closed. IMPRESSION: Living single IUP measuring 6 weeks 1 day, U/[facility] 10/21/2024. No subchorionic hemorrhage, cervical funneling or shortening. Anatomic fetal survey suggested at 18-[redacted] weeks gestational age. Electronically Signed   By: Francis Quam M.D.   On: 02/27/2024 01:43    MAU Management/MDM: Orders Placed This Encounter  Procedures   Wet prep, genital   US  OB LESS THAN 14 WEEKS WITH OB TRANSVAGINAL   Urinalysis, Routine w reflex microscopic -Urine, Clean Catch   CBC   Comprehensive metabolic panel with GFR   hCG, quantitative, pregnancy   Pregnancy, urine POC   ABO/Rh   Discharge patient Discharge disposition: 01-Home or Self Care; Discharge patient date: 02/27/2024    Meds ordered this encounter  Medications   glycopyrrolate  (ROBINUL ) tablet 2 mg   ondansetron  (ZOFRAN -ODT) disintegrating tablet 4 mg   scopolamine  (TRANSDERM-SCOP) 1 MG/3DAYS 1.5 mg   DISCONTD: metroNIDAZOLE  (METROGEL ) 0.75 % vaginal gel    Sig: Place 1 Applicatorful vaginally at bedtime. Apply  one applicatorful to vagina at bedtime for 10 days, then twice a week for 6 months.    Dispense:  70 g    Refill:  5   scopolamine  (TRANSDERM-SCOP) 1 MG/3DAYS    Sig: Place 1 patch (1.5 mg total) onto the skin every 3 (three) days.    Dispense:  10 patch    Refill:  12   glycopyrrolate  (ROBINUL ) 1 MG tablet    Sig: Take 2 tablets (2 mg total) by mouth 3 (three) times daily as needed for up to 20 doses (spitting).    Dispense:  20 tablet    Refill:  0   ondansetron  (ZOFRAN -ODT) 4 MG disintegrating tablet    Sig: Take 1 tablet (4 mg total) by mouth every 8 (eight) hours as needed for up to 20 doses for nausea or vomiting.    Dispense:  20 tablet    Refill:  1   metroNIDAZOLE  (METROGEL ) 0.75 % vaginal gel    Sig: Place 1 Applicatorful vaginally at bedtime for 5 days.    Dispense:  50 g    Refill:  0    ASSESSMENT 1. [redacted] weeks gestation of pregnancy   2. Bacterial vaginosis   3. Nausea/vomiting in pregnancy   SIUP with FHR 117, no subchorionic hemorrhage.   Tolerating PO after zofran , scopolamine  and robinul .   BV treat with Metrogel  x 5 days  PLAN Discharge home with strict return precautions. Allergies as of 02/27/2024       Reactions   Bee Pollen Itching   Pt reports pollen  Medication List     STOP taking these medications    levonorgestrel -ethinyl estradiol 0.1-20 MG-MCG tablet Commonly known as: ALESSE       TAKE these medications    glycopyrrolate  1 MG tablet Commonly known as: ROBINUL  Take 2 tablets (2 mg total) by mouth 3 (three) times daily as needed for up to 20 doses (spitting).   metroNIDAZOLE  0.75 % vaginal gel Commonly known as: METROGEL  Place 1 Applicatorful vaginally at bedtime for 5 days.   ondansetron  4 MG disintegrating tablet Commonly known as: ZOFRAN -ODT Take 1 tablet (4 mg total) by mouth every 8 (eight) hours as needed for up to 20 doses for nausea or vomiting.   Prenatal 28-0.8 MG Tabs Take 1 tablet by mouth daily.    promethazine  25 MG tablet Commonly known as: PHENERGAN  Take 1 tablet (25 mg total) by mouth every 6 (six) hours as needed for nausea or vomiting.   Transderm-Scop 1 MG/3DAYS Generic drug: scopolamine  Place 1 patch (1.5 mg total) onto the skin every 3 (three) days.   scopolamine  1 MG/3DAYS Commonly known as: TRANSDERM-SCOP Place 1 patch (1.5 mg total) onto the skin every 3 (three) days.         Mardy Shropshire, MD FMOB Fellow, Faculty practice [facility], [facility]  02/27/2024  4:43 AM

## 2024-02-28 ENCOUNTER — Other Ambulatory Visit: Payer: Self-pay

## 2024-02-28 ENCOUNTER — Ambulatory Visit

## 2024-02-28 ENCOUNTER — Other Ambulatory Visit (HOSPITAL_COMMUNITY)
Admission: RE | Admit: 2024-02-28 | Discharge: 2024-02-28 | Disposition: A | Discharge status: Home / Self Care | Source: Ambulatory Visit | Attending: Obstetrics and Gynecology | Admitting: Obstetrics and Gynecology

## 2024-02-28 VITALS — BP 112/69 | HR 89 | Wt 131.0 lb

## 2024-02-28 DIAGNOSIS — Z3401 Encounter for supervision of normal first pregnancy, first trimester: Secondary | ICD-10-CM | POA: Diagnosis present

## 2024-02-28 DIAGNOSIS — Z3480 Encounter for supervision of other normal pregnancy, unspecified trimester: Secondary | ICD-10-CM | POA: Insufficient documentation

## 2024-02-28 NOTE — Progress Notes (Signed)
 New OB Intake  I explained I am completing New OB Intake today. We discussed EDD of 10/18/2024, by Last Menstrual Period. Pt is [id number]. I reviewed her allergies, medications and Medical/Surgical/OB history.    Patient Active Problem List   Diagnosis Date Noted   Encounter for supervision of normal first pregnancy in first trimester 02/28/2024   BMI 30.0-30.9,adult 03/30/2023    Concerns addressed today  Patient informed that the ultrasound is considered a limited obstetric ultrasound and is not intended to be a complete ultrasound exam.  Patient also informed that the ultrasound is not being completed with the intent of assessing for fetal or placental anomalies or any pelvic abnormalities. Explained that the purpose of today's ultrasound is to assess for viability.  Patient acknowledges the purpose of the exam and the limitations of the study.     Delivery Plans Plans to deliver at [facility]. Discussed the nature of our practice with multiple providers including residents and students. Due to the size of the practice, the delivering provider may not be the same as those providing prenatal care.   MyChart/Babyscripts MyChart access verified. I explained pt will have some visits in office and some virtually. Babyscripts app discussed and ordered.   Blood Pressure Cuff Blood pressure cuff discussedDiscussed to be used for virtual visits and or if needed BP checks weekly.  Anatomy US  Explained first scheduled US  will be around 19 weeks.   Last Pap No results found for: DIAGPAP  First visit review I reviewed new OB appt with patient. Explained pt will be seen by Dr. Eppie at first visit. Discussed Jenn[place] genetic screening with patient and significant other. Routine prenatal labs ordered.    Erminio DELENA Rumps, [place] 02/28/2024  2:34 PM

## 2024-02-29 ENCOUNTER — Telehealth: Payer: Self-pay | Admitting: *Deleted

## 2024-02-29 LAB — CBC/D/PLT+RPR+RH+ABO+RUBIGG...
Antibody Screen: NEGATIVE
Basophils Absolute: 0 x10E3/uL (ref 0.0–0.2)
Basos: 1 %
EOS (ABSOLUTE): 0.1 x10E3/uL (ref 0.0–0.4)
Eos: 1 %
HCV Ab: NONREACTIVE
HIV Screen 4th Generation wRfx: NONREACTIVE
Hematocrit: 35.6 % (ref 34.0–46.6)
Hemoglobin: 11.5 g/dL (ref 11.1–15.9)
Hepatitis B Surface Ag: NEGATIVE
Immature Grans (Abs): 0 x10E3/uL (ref 0.0–0.1)
Immature Granulocytes: 0 %
Lymphocytes Absolute: 1.9 x10E3/uL (ref 0.7–3.1)
Lymphs: 39 %
MCH: 28.6 pg (ref 26.6–33.0)
MCHC: 32.3 g/dL (ref 31.5–35.7)
MCV: 89 fL (ref 79–97)
Monocytes Absolute: 0.5 x10E3/uL (ref 0.1–0.9)
Monocytes: 9 %
Neutrophils Absolute: 2.5 x10E3/uL (ref 1.4–7.0)
Neutrophils: 50 %
Platelets: 246 x10E3/uL (ref 150–450)
RBC: 4.02 x10E6/uL (ref 3.77–5.28)
RDW: 12.5 % (ref 11.7–15.4)
RPR Ser Ql: NONREACTIVE
Rh Factor: POSITIVE
Rubella Antibodies, IGG: 19.4 {index} (ref >0.99)
WBC: 5 x10E3/uL (ref 3.4–10.8)

## 2024-02-29 LAB — HCV INTERPRETATION

## 2024-02-29 NOTE — Telephone Encounter (Signed)
 Returned call to [facility] after reviewing chart.  Verified patient with two patient identifiers;   Spoke with Reena in [facility]acy at [facility] [address]  explained that the second Metrogel  prescription that was sent, which is for 5 days, was the correct prescription to fill.    Rosina RN

## 2024-03-01 ENCOUNTER — Ambulatory Visit: Payer: Self-pay | Admitting: Family Medicine

## 2024-03-01 DIAGNOSIS — Z3401 Encounter for supervision of normal first pregnancy, first trimester: Secondary | ICD-10-CM

## 2024-03-01 LAB — CERVICOVAGINAL ANCILLARY ONLY
Chlamydia: NEGATIVE
Comment: NEGATIVE
Comment: NORMAL
Neisseria Gonorrhea: NEGATIVE

## 2024-03-01 LAB — CULTURE, OB URINE

## 2024-03-01 LAB — URINE CULTURE, OB REFLEX: Organism ID, Bacteria: NO GROWTH

## 2024-03-09 ENCOUNTER — Inpatient Hospital Stay (HOSPITAL_COMMUNITY)
Admission: AD | Admit: 2024-03-09 | Discharge: 2024-03-09 | Disposition: A | Discharge status: Home / Self Care | Payer: Self-pay | Attending: Obstetrics & Gynecology | Admitting: Obstetrics & Gynecology

## 2024-03-09 DIAGNOSIS — O99891 Other specified diseases and conditions complicating pregnancy: Secondary | ICD-10-CM | POA: Insufficient documentation

## 2024-03-09 DIAGNOSIS — R519 Headache, unspecified: Secondary | ICD-10-CM

## 2024-03-09 DIAGNOSIS — O99011 Anemia complicating pregnancy, first trimester: Secondary | ICD-10-CM | POA: Diagnosis not present

## 2024-03-09 DIAGNOSIS — Z3A08 8 weeks gestation of pregnancy: Secondary | ICD-10-CM | POA: Diagnosis not present

## 2024-03-09 DIAGNOSIS — D509 Iron deficiency anemia, unspecified: Secondary | ICD-10-CM | POA: Insufficient documentation

## 2024-03-09 DIAGNOSIS — R42 Dizziness and giddiness: Secondary | ICD-10-CM | POA: Diagnosis not present

## 2024-03-09 LAB — COMPREHENSIVE METABOLIC PANEL WITH GFR
ALT: 11 U/L (ref 0–44)
AST: 14 U/L — ABNORMAL LOW (ref 15–41)
Albumin: 3.3 g/dL — ABNORMAL LOW (ref 3.5–5.0)
Alkaline Phosphatase: 26 U/L — ABNORMAL LOW (ref 38–126)
Anion gap: 8 (ref 5–15)
BUN: 7 mg/dL (ref 6–20)
CO2: 23 mmol/L (ref 22–32)
Calcium: 9.5 mg/dL (ref 8.9–10.3)
Chloride: 104 mmol/L (ref 98–111)
Creatinine, Ser: 0.43 mg/dL — ABNORMAL LOW (ref 0.44–1.00)
GFR, Estimated: >60 mL/min (ref >60)
Glucose, Bld: 97 mg/dL (ref 70–99)
Potassium: 3.6 mmol/L (ref 3.5–5.1)
Sodium: 135 mmol/L (ref 135–145)
Total Bilirubin: 0.6 mg/dL (ref 0.0–1.2)
Total Protein: 6.1 g/dL — ABNORMAL LOW (ref 6.5–8.1)

## 2024-03-09 LAB — CBC
HCT: 29 % — ABNORMAL LOW (ref 36.0–46.0)
Hemoglobin: 9.7 g/dL — ABNORMAL LOW (ref 12.0–15.0)
MCH: 27.9 pg (ref 26.0–34.0)
MCHC: 33.4 g/dL (ref 30.0–36.0)
MCV: 83.3 fL (ref 80.0–100.0)
Platelets: 234 K/uL (ref 150–400)
RBC: 3.48 MIL/uL — ABNORMAL LOW (ref 3.87–5.11)
RDW: 12.6 % (ref 11.5–15.5)
WBC: 6.2 K/uL (ref 4.0–10.5)
nRBC: 0 % (ref 0.0–0.2)

## 2024-03-09 MED ORDER — FERROUS SULFATE 325 (65 FE) MG PO TABS
ORAL_TABLET | ORAL | 11 refills | Status: DC
Start: 2024-03-09 — End: 2024-04-08

## 2024-03-09 MED ORDER — ACETAMINOPHEN-CAFFEINE 500-65 MG PO TABS
2.0000 | ORAL_TABLET | Freq: Once | ORAL | Status: AC
  Administered 2024-03-09: 2 via ORAL
  Filled 2024-03-09: qty 2

## 2024-03-09 MED ORDER — ACETAMINOPHEN-CAFFEINE 500-65 MG PO TABS: 2.0000 | ORAL_TABLET | Freq: Four times a day (QID) | ORAL | 0 refills | Status: AC | PRN

## 2024-03-09 MED ORDER — CYCLOBENZAPRINE HCL 10 MG PO TABS
10.0000 mg | ORAL_TABLET | Freq: Two times a day (BID) | ORAL | 0 refills | Status: AC | PRN
Start: 2024-03-09 — End: ?

## 2024-03-09 MED ORDER — DIPHENHYDRAMINE HCL 25 MG PO TABS: 25.0000 mg | ORAL_TABLET | Freq: Four times a day (QID) | ORAL | 0 refills | Status: AC | PRN

## 2024-03-09 NOTE — Discharge Instructions (Addendum)
 LOW HEMOGLOBIN: Low hemoglobin is very common during pregnancy and is usually because iron  is low. I would recommend we try to improve you hemoglobin level. We know that improving your pre-delivery hemoglobin reduces your risk of a hemorrhage.   I have sent a referral to set up IV iron  infusions so we can get your hemoglobin levels up sooner. In addition to that, I recommend starting an oral iron  supplement.  I would recommend you try a supplement called Blood Builder in addition to your prenatal vitamin. It is available via [employer] and also at stores like [employer]. It uses plant based iron  and is less prone to causing stomach and bowel upset. Here is a screen shot of the supplement.   Alternatively, you can start a regular iron  supplement in addition to your prenatal vitamin. Either ferrous sulfate , ferrous gluconate, or ferrous fumarate. I have sent a prescription so you have the dosing, but these can all be found over-the-counter if insurance will not cover a prescription.  For some people this can be constipating, so you can also add a daily stool softener (Miralax) if needed.  About 10% of people will have difficulty with iron  and it will cause GI upset. To reduce the chance of GI upset, take the supplement with food. Taking it at night so you sleep through potential side effects can also be helpful. If that does not help, taking a liquid formulation with food can help cause less GI symptoms.  HEADACHE: If you get a headache at home, I recommend doing the following: 1) Drink 64 oz of water. Make sure you have something to eat. 2) Take 1000mg  tylenol  (2 extra-strength pills). Alternatively, you can take Excedrin-Tension (NOT Excedrin migraine). Look for TENSION headache formulation. The main ingredients should be Acetaminophen  (same as Tylenol ) and Caffeine ; should NOT have Aspirin in the formulation  3) If you need to you can also take Benadryl . This will likely make you very sleepy. Please  take a nap as sleep can help headaches go away.   If your headache does not improve after trying these, please go to the MAU.

## 2024-03-09 NOTE — MAU Provider Note (Signed)
 Chief Complaint:  Headache   HPI   None     Mary Weber is a 18 y.o. H4E8968 at [redacted]w[redacted]d who presents to maternity admissions reporting headache and dizziness.  She reports that headache started around 2230 while on her way to work.  At that time, rated as 3/10.  She did not take any medication for it.  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  She sat down with minimal improvement.  When she went outside she became dizzy so she called EMS.  Currently, headache rated at 8/10.  No history of migraines.  Pregnancy Course: [facility]ce[facility] care at [facility] . Prenatal records reviewed.   Past Medical History:  Diagnosis Date   Chlamydia    Chlamydia    OB History  Gravida Para Term Preterm AB Living  5 1 1  3 1   SAB IAB Ectopic Multiple Live Births  1 2  0 1    # Outcome Date GA Lbr Len/2nd Weight Sex Type Anes PTL Lv  5 Current           4 SAB 12/2023          3 IAB 04/2023          2 Term 11/27/20 [redacted]w[redacted]d 11:50 / 00:28 3121 g M Vag-Spont EPI  LIV  1 IAB             Obstetric Comments  AB- pills, no complication   Past Surgical History:  Procedure Laterality Date   NO PAST SURGERIES     Family History  Problem Relation Age of Onset   Healthy Mother    Healthy Father    Social History   Tobacco Use   Smoking status: Never   Smokeless tobacco: Never  Vaping Use   Vaping status: Never Used  Substance Use Topics   Alcohol use: Not Currently   Drug use: Not Currently    Types: Marijuana   Allergies  Allergen Reactions   Bee Pollen Itching    Pt reports pollen    Medications Prior to Admission  Medication Sig Dispense Refill Last Dose/Taking   glycopyrrolate  (ROBINUL ) 1 MG tablet Take 2 tablets (2 mg total) by mouth 3 (three) times daily as needed for up to 20 doses (spitting). 20 tablet 0 Past Week   ondansetron  (ZOFRAN -ODT) 4 MG disintegrating tablet Take 1 tablet (4 mg total) by  mouth every 8 (eight) hours as needed for up to 20 doses for nausea or vomiting. 20 tablet 1 03/08/2024   Prenatal 28-0.8 MG TABS Take 1 tablet by mouth daily. 30 tablet 12 Past Week   promethazine  (PHENERGAN ) 25 MG tablet Take 1 tablet (25 mg total) by mouth every 6 (six) hours as needed for nausea or vomiting. 30 tablet 0 Past Week   scopolamine  (TRANSDERM-SCOP) 1 MG/3DAYS Place 1 patch (1.5 mg total) onto the skin every 3 (three) days. 10 patch 12 Past Week   scopolamine  (TRANSDERM-SCOP) 1 MG/3DAYS Place 1 patch (1.5 mg total) onto the skin every 3 (three) days. (Patient not taking: Reported on 02/28/2024) 10 patch 0     I have reviewed patient's Past Medical Hx, Surgical Hx, Family Hx, Social Hx, medications and allergies.   ROS  Pertinent items noted in HPI and remainder of comprehensive ROS otherwise negative.   PHYSICAL EXAM  Patient Vitals for the past 24 hrs:  BP Temp Temp src Pulse Resp  Height Weight  03/09/24 0108 113/65 98.3 F (36.8 C) Oral 90 12 4' 11 (1.499 m) 60.9 kg    Constitutional: Well-developed, well-nourished female in no acute distress.  HEENT: atraumatic, normocephalic. Neck has normal ROM. EOM intact. Cardiovascular: normal rate & rhythm, warm and well-perfused Respiratory: normal effort, no problems with respiration noted GI: Abd soft, non-tender, non-distended MSK: Extremities nontender, no edema, normal ROM Skin: warm and dry. Acyanotic, no jaundice or pallor. Neurologic: Alert and oriented x 4. No abnormal coordination. Psychiatric: Normal mood. Speech not slurred, not rapid/pressured. Patient is cooperative. GU: no CVA tenderness  Labs: Results for orders placed or performed during the hospital encounter of 03/09/24 (from the past 24 hours)  Comprehensive metabolic panel     Status: Abnormal   Collection Time: 03/09/24  1:53 AM  Result Value Ref Range   Sodium 135 135 - 145 mmol/L   Potassium 3.6 3.5 - 5.1 mmol/L   Chloride 104 98 - 111 mmol/L   CO2  23 22 - 32 mmol/L   Glucose, Bld 97 70 - 99 mg/dL   BUN 7 6 - 20 mg/dL   Creatinine, Ser 9.56 (L) 0.44 - 1.00 mg/dL   Calcium 9.5 8.9 - 89.6 mg/dL   Total Protein 6.1 (L) 6.5 - 8.1 g/dL   Albumin 3.3 (L) 3.5 - 5.0 g/dL   AST 14 (L) 15 - 41 U/L   ALT 11 0 - 44 U/L   Alkaline Phosphatase 26 (L) 38 - 126 U/L   Total Bilirubin 0.6 0.0 - 1.2 mg/dL   GFR, Estimated >39 >39 mL/min   Anion gap 8 5 - 15  CBC     Status: Abnormal   Collection Time: 03/09/24  1:53 AM  Result Value Ref Range   WBC 6.2 4.0 - 10.5 K/uL   RBC 3.48 (L) 3.87 - 5.11 MIL/uL   Hemoglobin 9.7 (L) 12.0 - 15.0 g/dL   HCT 70.9 (L) 63.9 - 53.9 %   MCV 83.3 80.0 - 100.0 fL   MCH 27.9 26.0 - 34.0 pg   MCHC 33.4 30.0 - 36.0 g/dL   RDW 87.3 88.4 - 84.4 %   Platelets 234 150 - 400 K/uL   nRBC 0.0 0.0 - 0.2 %    Imaging:  No results found.  EKG: EKG: there are no previous tracings available for comparison, normal sinus rhythm. I personally reviewed this EKG for intrepretation.  MDM & MAU COURSE  MDM: High  MAU Course: -Vital signs within normal limits. On review, blood pressure typically is in the low-normal range. -EKG to rule out arrhythmia. Shows NSR. -CMP and CBC for electrolytes, anemia. -Orthostatic vital signs for possible orthostatic hypotension. -Trial Excedrin-Tension for headache. -CBC shows significant anemia with Hgb 9.7. Start PO iron , will also refer for IV iron  infusions. -Orthostatic vital signs show decrease in systolic pressure of 16 and decrease of diastolic pressure of 10 from supine to after 3 minutes standing. Change in diastolic pressure meets criteria for orthostatic hypotension. -CMP without significant abnormalities. -On reassessment, patient sleeping and comfortable. Endorses headache has improved.  Differential diagnosis considered for dizziness includes but is not limited to: hypoglycemia, arrhythmia, anemia, orthostatic hypotension  Orders Placed This Encounter  Procedures    Comprehensive metabolic panel   CBC   Amb Referral to Intravenous Iron  Therapy   Orthostatic vital signs   ED EKG   Discharge patient   Meds ordered this encounter  Medications   acetaminophen -caffeine  (EXCEDRIN TENSION HEADACHE) 500-65 MG per tablet  2 tablet   ferrous sulfate  325 (65 FE) MG tablet    Sig: Take 1 tablet (325 mg total) by mouth with breakfast on Mondays, Wednesdays, and Fridays.    Dispense:  30 tablet    Refill:  11   acetaminophen -caffeine  (EXCEDRIN TENSION HEADACHE) 500-65 MG TABS per tablet    Sig: Take 2 tablets by mouth every 6 (six) hours as needed (headache).    Dispense:  60 tablet    Refill:  0   cyclobenzaprine  (FLEXERIL ) 10 MG tablet    Sig: Take 1 tablet (10 mg total) by mouth 2 (two) times daily as needed (headache, muscle aches).    Dispense:  20 tablet    Refill:  0   diphenhydrAMINE  (BENADRYL ) 25 MG tablet    Sig: Take 1 tablet (25 mg total) by mouth every 6 (six) hours as needed.    Dispense:  30 tablet    Refill:  0    ASSESSMENT   1. Iron  deficiency anemia, unspecified iron  deficiency anemia type   2. Acute nonintractable headache, unspecified headache type   3. Lightheadedness   4. [redacted] weeks gestation of pregnancy     PLAN  Discharge home in stable condition with return precautions.  Referral sent for IV iron  infusions. Also start PO iron  supplement. Discussed importance of good hydration and nutrition, slow position changes.   Follow-up Information     [facility] [place] Follow up.   Specialty: Obstetrics and Gynecology Why: As scheduled for ongoing prenatal care Contact information: [address] [place]  [address] [phone]                 Allergies as of 03/09/2024       Reactions   Bee Pollen Itching   Pt reports pollen         Medication List     TAKE these medications    acetaminophen -caffeine  500-65 MG Tabs per tablet Commonly  known as: EXCEDRIN TENSION HEADACHE Take 2 tablets by mouth every 6 (six) hours as needed (headache).   cyclobenzaprine  10 MG tablet Commonly known as: FLEXERIL  Take 1 tablet (10 mg total) by mouth 2 (two) times daily as needed (headache, muscle aches).   diphenhydrAMINE  25 MG tablet Commonly known as: BENADRYL  Take 1 tablet (25 mg total) by mouth every 6 (six) hours as needed.   ferrous sulfate  325 (65 FE) MG tablet Take 1 tablet (325 mg total) by mouth with breakfast on Mondays, Wednesdays, and Fridays.   glycopyrrolate  1 MG tablet Commonly known as: ROBINUL  Take 2 tablets (2 mg total) by mouth 3 (three) times daily as needed for up to 20 doses (spitting).   ondansetron  4 MG disintegrating tablet Commonly known as: ZOFRAN -ODT Take 1 tablet (4 mg total) by mouth every 8 (eight) hours as needed for up to 20 doses for nausea or vomiting.   Prenatal 28-0.8 MG Tabs Take 1 tablet by mouth daily.   promethazine  25 MG tablet Commonly known as: PHENERGAN  Take 1 tablet (25 mg total) by mouth every 6 (six) hours as needed for nausea or vomiting.   Transderm-Scop 1 MG/3DAYS Generic drug: scopolamine  Place 1 patch (1.5 mg total) onto the skin every 3 (three) days.   scopolamine  1 MG/3DAYS Commonly known as: TRANSDERM-SCOP Place 1 patch (1.5 mg total) onto the skin every 3 (three) days.        Joesph DELENA Sear, PA

## 2024-03-09 NOTE — MAU Note (Signed)
 Pt says she has H/A - started at 1030pm- on her way to work.  3/10- no meds .  Then at 2345 - she was working- at [employer] - her eyes became blurry - saw white lights .She sat down- then went outside - became dizzy - called EMS  at 2358.  Now - 8/10- H/A - no hx of H/A

## 2024-03-12 ENCOUNTER — Telehealth (HOSPITAL_COMMUNITY): Payer: Self-pay | Admitting: Pharmacy Technician

## 2024-03-12 ENCOUNTER — Other Ambulatory Visit (HOSPITAL_COMMUNITY): Payer: Self-pay | Admitting: Family Medicine

## 2024-03-12 ENCOUNTER — Telehealth (HOSPITAL_COMMUNITY): Payer: Self-pay | Admitting: Family Medicine

## 2024-03-12 ENCOUNTER — Telehealth: Payer: Self-pay | Admitting: Family Medicine

## 2024-03-12 DIAGNOSIS — D509 Iron deficiency anemia, unspecified: Secondary | ICD-10-CM

## 2024-03-12 NOTE — Telephone Encounter (Signed)
 Auth Submission: NO AUTH NEEDED Site of care: [facility] Payer: [place] [employer] Medicaid Medication & CPT/J Code(s) submitted: Venofer  (Iron  Sucrose) J1756 Diagnosis Code:  D50.9 Route of submission (phone, fax, portal):  Phone # Fax # Auth type: Buy/Bill HB Units/visits requested:  Venofer  300mg  x 3 doses Reference number: [id number] Approval from: 03/12/24 to 07/10/24     Dagoberto Armour, CPhT [facility] Phone: [phone] 03/12/2024

## 2024-03-12 NOTE — Telephone Encounter (Signed)
 Patient referred to infusion pharmacy team for ambulatory infusion of IV iron .  Insurance - [place] Medicaid Site of care - Site of care: [facility] Dx code - D50.9 IV Iron  Therapy - Venofer  300 mg x 3 Infusion appointments - Scheduling team will schedule patient as soon as possible.    Thank you,  Norton Blush, PharmD, BCSCP Pharmacist [facility]

## 2024-03-14 ENCOUNTER — Encounter: Admitting: Obstetrics and Gynecology

## 2024-03-15 DIAGNOSIS — D509 Iron deficiency anemia, unspecified: Secondary | ICD-10-CM | POA: Insufficient documentation

## 2024-03-19 ENCOUNTER — Inpatient Hospital Stay (HOSPITAL_COMMUNITY): Admission: RE | Admit: 2024-03-19 | Source: Ambulatory Visit

## 2024-03-19 ENCOUNTER — Encounter (HOSPITAL_COMMUNITY)
Admission: RE | Admit: 2024-03-19 | Discharge: 2024-03-19 | Disposition: A | Discharge status: Home / Self Care | Source: Ambulatory Visit | Attending: Family Medicine | Admitting: Family Medicine

## 2024-03-19 ENCOUNTER — Encounter (HOSPITAL_COMMUNITY)

## 2024-03-19 VITALS — BP 107/68 | HR 100 | Temp 97.7°F | Resp 16

## 2024-03-19 DIAGNOSIS — D500 Iron deficiency anemia secondary to blood loss (chronic): Secondary | ICD-10-CM | POA: Insufficient documentation

## 2024-03-19 MED ORDER — IRON SUCROSE 300 MG IVPB - SIMPLE MED
300.0000 mg | Freq: Once | Status: AC
  Administered 2024-03-19: 300 mg via INTRAVENOUS
  Filled 2024-03-19: qty 300

## 2024-03-26 ENCOUNTER — Encounter (HOSPITAL_COMMUNITY)

## 2024-03-26 ENCOUNTER — Encounter (HOSPITAL_COMMUNITY)
Admission: RE | Admit: 2024-03-26 | Discharge: 2024-03-26 | Disposition: A | Discharge status: Home / Self Care | Source: Ambulatory Visit | Attending: Family Medicine

## 2024-03-26 VITALS — BP 102/62 | HR 106 | Temp 98.0°F | Resp 16

## 2024-03-26 DIAGNOSIS — D500 Iron deficiency anemia secondary to blood loss (chronic): Secondary | ICD-10-CM | POA: Diagnosis not present

## 2024-03-26 MED ORDER — IRON SUCROSE 300 MG IVPB - SIMPLE MED
300.0000 mg | Freq: Once | Status: AC
  Administered 2024-03-26: 300 mg via INTRAVENOUS
  Filled 2024-03-26: qty 300

## 2024-04-02 ENCOUNTER — Encounter (HOSPITAL_COMMUNITY)

## 2024-04-02 ENCOUNTER — Encounter (HOSPITAL_COMMUNITY)
Admission: RE | Admit: 2024-04-02 | Discharge: 2024-04-02 | Disposition: A | Discharge status: Home / Self Care | Source: Ambulatory Visit | Attending: Family Medicine | Admitting: Family Medicine

## 2024-04-02 ENCOUNTER — Encounter: Payer: Self-pay | Admitting: Obstetrics & Gynecology

## 2024-04-02 VITALS — BP 111/62 | HR 97 | Temp 98.2°F | Resp 16

## 2024-04-02 DIAGNOSIS — D500 Iron deficiency anemia secondary to blood loss (chronic): Secondary | ICD-10-CM | POA: Diagnosis not present

## 2024-04-02 MED ORDER — IRON SUCROSE 300 MG IVPB - SIMPLE MED
300.0000 mg | Freq: Once | Status: AC
  Administered 2024-04-02: 300 mg via INTRAVENOUS
  Filled 2024-04-02: qty 300

## 2024-04-03 ENCOUNTER — Telehealth: Payer: Self-pay

## 2024-04-03 NOTE — Telephone Encounter (Signed)
 Patient called office with concerns of feeling crampy.  States she has been on her feet at work.  Denies any bleeding or spotting.  Inquired about H2O intake.  States she is not drinking enough.  Instructed to drink 8-10 glasses of water each day.  Encouraged to call if cramping is ongoing.  Patient has appt on 04/08/24.  Erminio DELENA Rumps, RN

## 2024-04-08 ENCOUNTER — Encounter: Payer: Self-pay | Admitting: Obstetrics & Gynecology

## 2024-04-08 ENCOUNTER — Ambulatory Visit (INDEPENDENT_AMBULATORY_CARE_PROVIDER_SITE_OTHER): Admitting: Obstetrics & Gynecology

## 2024-04-08 VITALS — BP 119/71 | HR 103 | Wt 133.0 lb

## 2024-04-08 DIAGNOSIS — Z8759 Personal history of other complications of pregnancy, childbirth and the puerperium: Secondary | ICD-10-CM

## 2024-04-08 DIAGNOSIS — Z3A12 12 weeks gestation of pregnancy: Secondary | ICD-10-CM

## 2024-04-08 DIAGNOSIS — O99011 Anemia complicating pregnancy, first trimester: Secondary | ICD-10-CM

## 2024-04-08 DIAGNOSIS — Z3480 Encounter for supervision of other normal pregnancy, unspecified trimester: Secondary | ICD-10-CM

## 2024-04-08 DIAGNOSIS — Z1332 Encounter for screening for maternal depression: Secondary | ICD-10-CM

## 2024-04-08 DIAGNOSIS — Z1331 Encounter for screening for depression: Secondary | ICD-10-CM

## 2024-04-08 MED ORDER — ASPIRIN 81 MG PO TBEC: 162.0000 mg | DELAYED_RELEASE_TABLET | Freq: Every day | ORAL | 2 refills | Status: AC

## 2024-04-08 MED ORDER — FERRIC MALTOL 30 MG PO CAPS: 1.0000 | ORAL_CAPSULE | Freq: Two times a day (BID) | ORAL | 2 refills | Status: DC

## 2024-04-08 NOTE — Progress Notes (Signed)
 INITIAL PRENATAL VISIT  History:  Mary Weber is a 18 y.o. [phone] at [redacted]w[redacted]d by LMP, early ultrasound being seen today for her first obstetrical visit.  Her obstetrical history is significant for one term vaginal delivery in 2022 complicated by [facility], she was 16 at that time. Followed by early SAB and IAB. Patient does intend to breast feed. Pregnancy history fully reviewed.  Accompanied by her FOB.  Patient reports no complaints.  HISTORY: OB History  Gravida Para Term Preterm AB Living  5 1 1  0 3 1  SAB IAB Ectopic Multiple Live Births  1 2 0 0 1    # Outcome Date GA Lbr Len/2nd Weight Sex Type Anes PTL Lv  5 Current           4 SAB 12/2023          3 IAB 04/2023          2 Term 11/27/20 [redacted]w[redacted]d 11:50 / 00:28 6 lb 14.1 oz (3.121 kg) M Vag-Spont EPI  LIV     Name: Piechocki,BOY Alyscia     Apgar1: 8  Apgar5: 9  1 IAB             Obstetric Comments  AB- pills, no complication    Past Medical History:  Diagnosis Date   Chlamydia    Gestational hypertension    Past Surgical History:  Procedure Laterality Date   NO PAST SURGERIES     Family History  Problem Relation Age of Onset   Healthy Mother    Healthy Father    Social History   Tobacco Use   Smoking status: Never   Smokeless tobacco: Never  Vaping Use   Vaping status: Never Used  Substance Use Topics   Alcohol use: Not Currently   Drug use: Not Currently    Types: Marijuana   Allergies  Allergen Reactions   Bee Pollen Itching    Pt reports pollen    Current Outpatient Medications on File Prior to Visit  Medication Sig Dispense Refill   cyclobenzaprine  (FLEXERIL ) 10 MG tablet Take 1 tablet (10 mg total) by mouth 2 (two) times daily as needed (headache, muscle aches). 20 tablet 0   ondansetron  (ZOFRAN -ODT) 4 MG disintegrating tablet Take 1 tablet (4 mg total) by mouth every 8 (eight) hours as needed for up to 20 doses for nausea or vomiting. 20 tablet 1   Prenatal 28-0.8 MG TABS Take 1 tablet by mouth  daily. 30 tablet 12   acetaminophen -caffeine  (EXCEDRIN TENSION HEADACHE) 500-65 MG TABS per tablet Take 2 tablets by mouth every 6 (six) hours as needed (headache). (Patient not taking: Reported on 04/08/2024) 60 tablet 0   diphenhydrAMINE  (BENADRYL ) 25 MG tablet Take 1 tablet (25 mg total) by mouth every 6 (six) hours as needed. (Patient not taking: Reported on 04/08/2024) 30 tablet 0   glycopyrrolate  (ROBINUL ) 1 MG tablet Take 2 tablets (2 mg total) by mouth 3 (three) times daily as needed for up to 20 doses (spitting). (Patient not taking: Reported on 04/08/2024) 20 tablet 0   promethazine  (PHENERGAN ) 25 MG tablet Take 1 tablet (25 mg total) by mouth every 6 (six) hours as needed for nausea or vomiting. (Patient not taking: Reported on 04/08/2024) 30 tablet 0   scopolamine  (TRANSDERM-SCOP) 1 MG/3DAYS Place 1 patch (1.5 mg total) onto the skin every 3 (three) days. (Patient not taking: Reported on 04/08/2024) 10 patch 0   scopolamine  (TRANSDERM-SCOP) 1 MG/3DAYS Place 1 patch (1.5 mg total)  onto the skin every 3 (three) days. (Patient not taking: Reported on 04/08/2024) 10 patch 12   No current facility-administered medications on file prior to visit.   Review of Systems Pertinent items noted in HPI and remainder of comprehensive ROS otherwise negative.  Physical Exam:   Vitals:   04/08/24 0923  BP: 119/71  Pulse: (!) 103  Weight: 133 lb (60.3 kg)   Fetal Heart Rate (bpm): 167    General: well-developed, well-nourished female in no acute distress  Breasts:  deferred  Skin: normal coloration and turgor, no rashes  Neurologic: oriented, normal, negative, normal mood  Extremities: normal strength, tone, and muscle mass, ROM of all joints is normal  HEENT PERRLA, extraocular movement intact and sclera clear, anicteric  Neck supple and no masses  Cardiovascular: regular rate and rhythm  Respiratory:  no respiratory distress, normal breath sounds  Abdomen: soft, non-tender; bowel sounds normal;  no masses,  no organomegaly  Pelvic: deferred     Results for orders placed or performed during the hospital encounter of 03/09/24 (from the past 6 weeks)  Comprehensive metabolic panel   Collection Time: 03/09/24  1:53 AM  Result Value Ref Range   Sodium 135 135 - 145 mmol/L   Potassium 3.6 3.5 - 5.1 mmol/L   Chloride 104 98 - 111 mmol/L   CO2 23 22 - 32 mmol/L   Glucose, Bld 97 70 - 99 mg/dL   BUN 7 6 - 20 mg/dL   Creatinine, Ser 9.56 (L) 0.44 - 1.00 mg/dL   Calcium 9.5 8.9 - 89.6 mg/dL   Total Protein 6.1 (L) 6.5 - 8.1 g/dL   Albumin 3.3 (L) 3.5 - 5.0 g/dL   AST 14 (L) 15 - 41 U/L   ALT 11 0 - 44 U/L   Alkaline Phosphatase 26 (L) 38 - 126 U/L   Total Bilirubin 0.6 0.0 - 1.2 mg/dL   GFR, Estimated >39 >39 mL/min   Anion gap 8 5 - 15  CBC   Collection Time: 03/09/24  1:53 AM  Result Value Ref Range   WBC 6.2 4.0 - 10.5 K/uL   RBC 3.48 (L) 3.87 - 5.11 MIL/uL   Hemoglobin 9.7 (L) 12.0 - 15.0 g/dL   HCT 70.9 (L) 63.9 - 53.9 %   MCV 83.3 80.0 - 100.0 fL   MCH 27.9 26.0 - 34.0 pg   MCHC 33.4 30.0 - 36.0 g/dL   RDW 87.3 88.4 - 84.4 %   Platelets 234 150 - 400 K/uL   nRBC 0.0 0.0 - 0.2 %  Results for orders placed or performed in visit on 02/28/24 (from the past 6 weeks)  Cervicovaginal ancillary only   Collection Time: 02/28/24  2:30 PM  Result Value Ref Range   Neisseria Gonorrhea Negative    Chlamydia Negative    Comment Normal Reference Ranger Chlamydia - Negative    Comment      Normal Reference Range Neisseria Gonorrhea - Negative  Culture, OB Urine   Collection Time: 02/28/24  2:31 PM   Specimen: Urine   Urine  Result Value Ref Range   Urine Culture, OB Final report   Urine Culture, OB Reflex   Collection Time: 02/28/24  2:31 PM  Result Value Ref Range   Organism ID, Bacteria No growth   CBC/D/Plt+RPR+Rh+ABO+RubIgG...   Collection Time: 02/28/24  2:36 PM  Result Value Ref Range   Hepatitis B Surface Ag Negative Negative   HCV Ab Non Reactive Non Reactive    RPR  Ser Ql Non Reactive Non Reactive   Rubella Antibodies, IGG 19.40 Immune >0.99 index   ABO Grouping O    Rh Factor Positive    Antibody Screen Negative Negative   HIV Screen 4th Generation wRfx Non Reactive Non Reactive   WBC 5.0 3.4 - 10.8 x10E3/uL   RBC 4.02 3.77 - 5.28 x10E6/uL   Hemoglobin 11.5 11.1 - 15.9 g/dL   Hematocrit 64.3 65.9 - 46.6 %   MCV 89 79 - 97 fL   MCH 28.6 26.6 - 33.0 pg   MCHC 32.3 31.5 - 35.7 g/dL   RDW 87.4 88.2 - 84.5 %   Platelets 246 150 - 450 x10E3/uL   Neutrophils 50 Not Estab. %   Lymphs 39 Not Estab. %   Monocytes 9 Not Estab. %   Eos 1 Not Estab. %   Basos 1 Not Estab. %   Neutrophils Absolute 2.5 1.4 - 7.0 x10E3/uL   Lymphocytes Absolute 1.9 0.7 - 3.1 x10E3/uL   Monocytes Absolute 0.5 0.1 - 0.9 x10E3/uL   EOS (ABSOLUTE) 0.1 0.0 - 0.4 x10E3/uL   Basophils Absolute 0.0 0.0 - 0.2 x10E3/uL   Immature Granulocytes 0 Not Estab. %   Immature Grans (Abs) 0.0 0.0 - 0.1 x10E3/uL  Interpretation:   Collection Time: 02/28/24  2:36 PM  Result Value Ref Range   HCV Interp 1: Comment   Results for orders placed or performed during the hospital encounter of 02/26/24 (from the past 6 weeks)  Urinalysis, Routine w reflex microscopic -Urine, Clean Catch   Collection Time: 02/27/24 12:07 AM  Result Value Ref Range   Color, Urine AMBER (A) YELLOW   APPearance CLOUDY (A) CLEAR   Specific Gravity, Urine 1.029 1.005 - 1.030   pH 5.0 5.0 - 8.0   Glucose, UA NEGATIVE NEGATIVE mg/dL   Hgb urine dipstick NEGATIVE NEGATIVE   Bilirubin Urine NEGATIVE NEGATIVE   Ketones, ur 20 (A) NEGATIVE mg/dL   Protein, ur 30 (A) NEGATIVE mg/dL   Nitrite NEGATIVE NEGATIVE   Leukocytes,Ua MODERATE (A) NEGATIVE   RBC / HPF 0-5 0 - 5 RBC/hpf   WBC, UA 0-5 0 - 5 WBC/hpf   Bacteria, UA RARE (A) NONE SEEN   Squamous Epithelial / HPF 21-50 0 - 5 /HPF   Mucus PRESENT   Pregnancy, urine POC   Collection Time: 02/27/24 12:19 AM  Result Value Ref Range   Preg Test, Ur POSITIVE  (A) NEGATIVE  Wet prep, genital   Collection Time: 02/27/24 12:20 AM  Result Value Ref Range   Yeast Wet Prep HPF POC NONE SEEN NONE SEEN   Trich, Wet Prep NONE SEEN NONE SEEN   Clue Cells Wet Prep HPF POC PRESENT (A) NONE SEEN   WBC, Wet Prep HPF POC >=10 (A) <10   Sperm NONE SEEN   GC/Chlamydia probe amp ([place])not at [facility]   Collection Time: 02/27/24 12:24 AM  Result Value Ref Range   Neisseria Gonorrhea Negative    Chlamydia Negative    Comment Normal Reference Ranger Chlamydia - Negative    Comment      Normal Reference Range Neisseria Gonorrhea - Negative  CBC   Collection Time: 02/27/24  1:02 AM  Result Value Ref Range   WBC 7.6 4.0 - 10.5 K/uL   RBC 4.08 3.87 - 5.11 MIL/uL   Hemoglobin 11.4 (L) 12.0 - 15.0 g/dL   HCT 66.0 (L) 63.9 - 53.9 %   MCV 83.1 80.0 - 100.0 fL   MCH 27.9  26.0 - 34.0 pg   MCHC 33.6 30.0 - 36.0 g/dL   RDW 87.4 88.4 - 84.4 %   Platelets 233 150 - 400 K/uL   nRBC 0.0 0.0 - 0.2 %  Comprehensive metabolic panel with GFR   Collection Time: 02/27/24  1:02 AM  Result Value Ref Range   Sodium 138 135 - 145 mmol/L   Potassium 3.5 3.5 - 5.1 mmol/L   Chloride 106 98 - 111 mmol/L   CO2 22 22 - 32 mmol/L   Glucose, Bld 98 70 - 99 mg/dL   BUN 6 6 - 20 mg/dL   Creatinine, Ser 9.44 0.44 - 1.00 mg/dL   Calcium 89.3 (H) 8.9 - 10.3 mg/dL   Total Protein 7.1 6.5 - 8.1 g/dL   Albumin 3.9 3.5 - 5.0 g/dL   AST 16 15 - 41 U/L   ALT 10 0 - 44 U/L   Alkaline Phosphatase 34 (L) 38 - 126 U/L   Total Bilirubin 0.8 0.0 - 1.2 mg/dL   GFR, Estimated >39 >39 mL/min   Anion gap 10 5 - 15  hCG, quantitative, pregnancy   Collection Time: 02/27/24  1:02 AM  Result Value Ref Range   hCG, Beta Chain, Quant, S 104,608 (H) <5 mIU/mL  ABO/Rh   Collection Time: 02/27/24  1:02 AM  Result Value Ref Range   ABO/RH(D) O POS    No rh immune globuloin      NOT A RH IMMUNE GLOBULIN CANDIDATE, PT RH POSITIVE Performed at [facility], [address] [place],  [place] [address]     Assessment:  Pregnancy: [id number] Patient Active Problem List   Diagnosis Date Noted   Iron  deficiency anemia, unspecified 03/15/2024   Supervision of other normal pregnancy, antepartum 02/28/2024   History of gestational hypertension 03/31/2023   BMI 30.0-30.9,adult 03/30/2023    Plan:  1. Anemia affecting pregnancy in first trimester    Latest Ref Rng & Units 03/09/2024    1:53 AM 02/28/2024    2:36 PM 02/27/2024    1:02 AM  CBC  WBC 4.0 - 10.5 K/uL 6.2  5.0  7.6   Hemoglobin 12.0 - 15.0 g/dL 9.7  88.4  88.5   Hematocrit 36.0 - 46.0 % 29.0  35.6  33.9   Platelets 150 - 400 K/uL 234  246  233   Will follow up labs today.  Accrufer prescribed in lieu of regular ferrous sulfate .  Will recheck levels later in second trimester (`20 weeks). - CBC - Ferritin - Ferric Maltol 30 MG CAPS; Take 1 capsule (30 mg total) by mouth 2 (two) times daily. Please take one hour before breakfast and dinner  Dispense: 60 capsule; Refill: 2  2. History of gestational hypertension Aspirin prescribed for preeclampsia prophylaxis. - aspirin EC 81 MG tablet; Take 2 tablets (162 mg total) by mouth at bedtime. Start taking when you are [redacted] weeks pregnant for rest of pregnancy for prevention of preeclampsia  Dispense: 300 tablet; Refill: 2  3. Positive screening for depression on 9-item Patient Health Questionnaire (PHQ-9) Patient verbally consented to [facility]al [facility] services about presenting concerns. - Amb ref to [facility]  4. [redacted] weeks gestation of pregnancy 5. Supervision of other normal pregnancy, antepartum (Primary) - PANORAMA PRENATAL TEST - HORIZON Basic Panel - Hemoglobin A1c - TSH Rfx on Abnormal to Free T4 - US  MFM OB COMP + 14 WK; Future  Initial labs reviewed, other labs ordered today. Continue prenatal vitamins. Problem  list reviewed and updated. Genetic Screening discussed, Panorama and Horizon: ordered. Ultrasound discussed; fetal anatomic  survey: scheduled. Anticipatory guidance about prenatal visits given including labs, ultrasounds, and testing. Weight gain recommendations per IOM guidelines reviewed: underweight/BMI 18.5 or less > 28 - 40 lbs; normal weight/BMI 18.5 - 24.9 > 25 - 35 lbs; overweight/BMI 25 - 29.9 > 15 - 25 lbs; obese/BMI 30 or more > 11 - 20 lbs. Patient was encouraged to use MyChart to review results, send requests, and have questions addressed.   The nature of Center for [facility] Healthcare/Faculty Practice with multiple MDs and Advanced Practice Providers was explained to patient; also emphasized that residents, students are part of our team. Routine obstetric precautions reviewed. Encouraged to seek out care at our office or emergency room [facility] MAU preferred) for urgent and/or emergent concerns. Return in about 4 weeks (around 05/06/2024) for OFFICE OB VISIT (MD or APP).     GLORIS HUGGER, MD, FACOG Obstetrician & Gynecologist, [facility] for [employer], [facility]

## 2024-04-09 ENCOUNTER — Ambulatory Visit: Payer: Self-pay | Admitting: Obstetrics & Gynecology

## 2024-04-09 DIAGNOSIS — Z3480 Encounter for supervision of other normal pregnancy, unspecified trimester: Secondary | ICD-10-CM

## 2024-04-09 LAB — CBC
Hematocrit: 34.3 % (ref 34.0–46.6)
Hemoglobin: 10.7 g/dL — ABNORMAL LOW (ref 11.1–15.9)
MCH: 27.6 pg (ref 26.6–33.0)
MCHC: 31.2 g/dL — ABNORMAL LOW (ref 31.5–35.7)
MCV: 88 fL (ref 79–97)
Platelets: 264 x10E3/uL (ref 150–450)
RBC: 3.88 x10E6/uL (ref 3.77–5.28)
RDW: 12.7 % (ref 11.7–15.4)
WBC: 4.7 x10E3/uL (ref 3.4–10.8)

## 2024-04-09 LAB — FERRITIN: Ferritin: 682 ng/mL — ABNORMAL HIGH (ref 15–150)

## 2024-04-09 LAB — TSH RFX ON ABNORMAL TO FREE T4: TSH: 0.573 u[IU]/mL (ref 0.450–4.500)

## 2024-04-09 LAB — HEMOGLOBIN A1C
Est. average glucose Bld gHb Est-mCnc: 103 mg/dL
Hgb A1c MFr Bld: 5.2 % (ref 4.8–5.6)

## 2024-04-09 NOTE — Telephone Encounter (Signed)
-----   Message from Dalton Anyanwu sent at 04/09/2024  8:17 AM EDT ----- No iron  deficiency anemia noted, no need for supplemental iron  for now.  Please tell patient not to take the prescribed Accrufer for now given her high ferritin level.  Will recheck Anemia Profile B  lab panel (this checks CBC, ferritin, folate, B12) around 20 weeks, and manage accordingly.  Please call to inform patient of results and recommendations.  GLORIS HUGGER, MD, FACOG Obstetrician & Gynecologist, [facility] for [facility], [facility]  ----- Message ----- From: Interface, [facility] Lab Results In Sent: 04/09/2024   5:37 AM EDT To: GLORIS DELENA HUGGER, MD

## 2024-04-09 NOTE — Progress Notes (Signed)
 No iron  deficiency anemia noted, no need for supplemental iron  for now.  Please tell patient not to take the prescribed Accrufer for now given her high ferritin level.  Will recheck Anemia Profile B lab panel (this checks CBC, ferritin, folate, B12) around 20 weeks, and manage accordingly.  Please call to inform patient of results and recommendations.  Mary HUGGER, MD, FACOG Obstetrician & Gynecologist, [facility] for [employer], [facility]

## 2024-04-09 NOTE — Telephone Encounter (Signed)
 Patient aware of lab results and instructions from Dr. DELENA.  Will recheck labs at 20 weeks.  Patient verbalizes understanding.  Erminio DELENA Rumps [employer]

## 2024-04-13 LAB — PANORAMA PRENATAL TEST FULL PANEL:PANORAMA TEST PLUS 5 ADDITIONAL MICRODELETIONS: FETAL FRACTION: 6.8

## 2024-04-15 LAB — HORIZON CUSTOM: REPORT SUMMARY: NEGATIVE

## 2024-05-08 ENCOUNTER — Ambulatory Visit (INDEPENDENT_AMBULATORY_CARE_PROVIDER_SITE_OTHER): Admitting: Family Medicine

## 2024-05-08 VITALS — BP 111/61 | HR 102 | Wt 139.0 lb

## 2024-05-08 DIAGNOSIS — Z3A16 16 weeks gestation of pregnancy: Secondary | ICD-10-CM | POA: Diagnosis not present

## 2024-05-08 DIAGNOSIS — Z8759 Personal history of other complications of pregnancy, childbirth and the puerperium: Secondary | ICD-10-CM | POA: Diagnosis not present

## 2024-05-08 DIAGNOSIS — Z3480 Encounter for supervision of other normal pregnancy, unspecified trimester: Secondary | ICD-10-CM | POA: Diagnosis not present

## 2024-05-08 NOTE — Progress Notes (Signed)
   PRENATAL VISIT NOTE  Subjective:  Mary Weber is a 18 y.o. [phone] at [redacted]w[redacted]d being seen today for ongoing prenatal care.  She is currently monitored for the following issues for this low-risk pregnancy and has BMI 30.0-30.9,adult; History of gestational hypertension; Supervision of other normal pregnancy, antepartum; and Iron  deficiency anemia, unspecified on their problem list.  Patient reports no complaints.  Contractions: Not present. Vag. Bleeding: None.  Movement: Absent. Denies leaking of fluid.   The following portions of the patient's history were reviewed and updated as appropriate: allergies, current medications, past family history, past medical history, past social history, past surgical history and problem list.   Objective:    Vitals:   05/08/24 1104  BP: 111/61  Pulse: (!) 102  Weight: 139 lb (63 kg)    Fetal Status:  Fetal Heart Rate (bpm): 160   Movement: Absent    General: Alert, oriented and cooperative. Patient is in no acute distress.  Skin: Skin is warm and dry. No rash noted.   Cardiovascular: Normal heart rate noted  Respiratory: Normal respiratory effort, no problems with respiration noted  Abdomen: Soft, gravid, appropriate for gestational age.  Pain/Pressure: Absent     Pelvic: Cervical exam deferred        Extremities: Normal range of motion.  Edema: None  Mental Status: Normal mood and affect. Normal behavior. Normal judgment and thought content.   Assessment and Plan:  Pregnancy: G5P1031 at [redacted]w[redacted]d 1. Supervision of other normal pregnancy, antepartum (Primary) - AFP, Serum, Open Spina Bifida  2. [redacted] weeks gestation of pregnancy - AFP, Serum, Open Spina Bifida  3. History of gestational hypertension BP normal On ASA 81mg   Preterm labor symptoms and general obstetric precautions including but not limited to vaginal bleeding, contractions, leaking of fluid and fetal movement were reviewed in detail with the patient. Please refer to After  Visit Summary for other counseling recommendations.   No follow-ups on file.  Future Appointments  Date Time Provider Department Center  06/04/2024  9:15 AM Synthia Raisin, CNM [facility] None  06/19/2024 10:15 AM [facility] PROVIDER 1 [facility]  06/19/2024 10:30 AM [facility] US1 [facility]    Lang JINNY Peel, DO

## 2024-05-10 ENCOUNTER — Ambulatory Visit: Payer: Self-pay | Admitting: Family Medicine

## 2024-05-10 DIAGNOSIS — Z3480 Encounter for supervision of other normal pregnancy, unspecified trimester: Secondary | ICD-10-CM

## 2024-05-10 LAB — AFP, SERUM, OPEN SPINA BIFIDA
AFP MoM: 0.92
AFP Value: 37.2 ng/mL
Gest. Age on Collection Date: 16.5 wk
Maternal Age At EDD: 21 a
OSBR Risk 1 IN: 10000
Test Results:: NEGATIVE
Weight: 139 [lb_av]

## 2024-05-17 ENCOUNTER — Ambulatory Visit

## 2024-05-17 ENCOUNTER — Other Ambulatory Visit

## 2024-05-23 ENCOUNTER — Inpatient Hospital Stay (HOSPITAL_COMMUNITY)

## 2024-05-23 ENCOUNTER — Telehealth: Payer: Self-pay

## 2024-05-23 ENCOUNTER — Encounter: Payer: Self-pay | Admitting: Certified Nurse Midwife

## 2024-05-23 ENCOUNTER — Inpatient Hospital Stay (HOSPITAL_COMMUNITY)
Admission: AD | Admit: 2024-05-23 | Discharge: 2024-05-23 | Disposition: A | Discharge status: Home / Self Care | Attending: Obstetrics & Gynecology | Admitting: Obstetrics & Gynecology

## 2024-05-23 DIAGNOSIS — O26892 Other specified pregnancy related conditions, second trimester: Secondary | ICD-10-CM | POA: Diagnosis present

## 2024-05-23 DIAGNOSIS — R1013 Epigastric pain: Secondary | ICD-10-CM | POA: Insufficient documentation

## 2024-05-23 DIAGNOSIS — Z3A18 18 weeks gestation of pregnancy: Secondary | ICD-10-CM

## 2024-05-23 DIAGNOSIS — D509 Iron deficiency anemia, unspecified: Secondary | ICD-10-CM

## 2024-05-23 DIAGNOSIS — O99012 Anemia complicating pregnancy, second trimester: Secondary | ICD-10-CM | POA: Diagnosis not present

## 2024-05-23 DIAGNOSIS — O99612 Diseases of the digestive system complicating pregnancy, second trimester: Secondary | ICD-10-CM | POA: Insufficient documentation

## 2024-05-23 DIAGNOSIS — O99011 Anemia complicating pregnancy, first trimester: Secondary | ICD-10-CM

## 2024-05-23 DIAGNOSIS — K219 Gastro-esophageal reflux disease without esophagitis: Secondary | ICD-10-CM | POA: Diagnosis not present

## 2024-05-23 DIAGNOSIS — R102 Pelvic and perineal pain unspecified side: Secondary | ICD-10-CM | POA: Insufficient documentation

## 2024-05-23 DIAGNOSIS — N949 Unspecified condition associated with female genital organs and menstrual cycle: Secondary | ICD-10-CM

## 2024-05-23 LAB — CBC
HCT: 28.1 % — ABNORMAL LOW (ref 36.0–46.0)
Hemoglobin: 9.4 g/dL — ABNORMAL LOW (ref 12.0–15.0)
MCH: 28.6 pg (ref 26.0–34.0)
MCHC: 33.5 g/dL (ref 30.0–36.0)
MCV: 85.4 fL (ref 80.0–100.0)
Platelets: 221 K/uL (ref 150–400)
RBC: 3.29 MIL/uL — ABNORMAL LOW (ref 3.87–5.11)
RDW: 14.3 % (ref 11.5–15.5)
WBC: 6.6 K/uL (ref 4.0–10.5)
nRBC: 0 % (ref 0.0–0.2)

## 2024-05-23 LAB — URINALYSIS, ROUTINE W REFLEX MICROSCOPIC
Bilirubin Urine: NEGATIVE
Glucose, UA: NEGATIVE mg/dL
Hgb urine dipstick: NEGATIVE
Ketones, ur: NEGATIVE mg/dL
Leukocytes,Ua: NEGATIVE
Nitrite: NEGATIVE
Protein, ur: NEGATIVE mg/dL
Specific Gravity, Urine: 1.008 (ref 1.005–1.030)
pH: 8 (ref 5.0–8.0)

## 2024-05-23 LAB — WET PREP, GENITAL
Clue Cells Wet Prep HPF POC: NONE SEEN
Sperm: NONE SEEN
Trich, Wet Prep: NONE SEEN
WBC, Wet Prep HPF POC: <10 (ref <10)
Yeast Wet Prep HPF POC: NONE SEEN

## 2024-05-23 LAB — COMPREHENSIVE METABOLIC PANEL WITH GFR
ALT: 12 U/L (ref 0–44)
AST: 17 U/L (ref 15–41)
Albumin: 3 g/dL — ABNORMAL LOW (ref 3.5–5.0)
Alkaline Phosphatase: 34 U/L — ABNORMAL LOW (ref 38–126)
Anion gap: 8 (ref 5–15)
BUN: <5 mg/dL — ABNORMAL LOW (ref 6–20)
CO2: 22 mmol/L (ref 22–32)
Calcium: 9 mg/dL (ref 8.9–10.3)
Chloride: 105 mmol/L (ref 98–111)
Creatinine, Ser: 0.36 mg/dL — ABNORMAL LOW (ref 0.44–1.00)
GFR, Estimated: >60 mL/min (ref >60)
Glucose, Bld: 75 mg/dL (ref 70–99)
Potassium: 3.7 mmol/L (ref 3.5–5.1)
Sodium: 135 mmol/L (ref 135–145)
Total Bilirubin: 0.6 mg/dL (ref 0.0–1.2)
Total Protein: 6.3 g/dL — ABNORMAL LOW (ref 6.5–8.1)

## 2024-05-23 MED ORDER — ACETAMINOPHEN 500 MG PO TABS
1000.0000 mg | ORAL_TABLET | Freq: Once | ORAL | Status: AC
  Administered 2024-05-23: 1000 mg via ORAL
  Filled 2024-05-23: qty 2

## 2024-05-23 MED ORDER — FERRIC MALTOL 30 MG PO CAPS: 1.0000 | ORAL_CAPSULE | Freq: Two times a day (BID) | ORAL | 4 refills | Status: AC

## 2024-05-23 MED ORDER — METOCLOPRAMIDE HCL 10 MG PO TABS: 10.0000 mg | ORAL_TABLET | Freq: Four times a day (QID) | ORAL | 0 refills | Status: AC

## 2024-05-23 NOTE — Telephone Encounter (Signed)
 Patient called with complaints of abd cramping in the night and continues through the morning.  States she is drinking 6-7 glasses of water a day.  Instructed patient to go to MAU for evaluation.  Patient verbalized understanding.  Erminio DELENA Rumps, RN

## 2024-05-23 NOTE — MAU Note (Signed)
 Pt medicated.  Reassessed.  Plan discussed. Lab in for blood draw.  Waiting on US 

## 2024-05-23 NOTE — MAU Note (Signed)
 Mary Weber is a 18 y.o. at [redacted]w[redacted]d here in MAU reporting: woke up at 0300, with cramping in upper abd (8/10), drunk some water to try to help.  Eventually was able to go back to sleep.  This morning, she is still having some pain in upper abd(mid)  is  a 6 now.  Also having some cramping in lower abd and kind of on the side, hurts when standing, walking and certain movement.  Denies bleeding. No diarrhea or constipation. No urinary symptoms.  Onset of complaint: 0300 Pain score: upper 6, lower 6 Vitals:   05/23/24 1155  BP: 117/67  Pulse: 95  Resp: 16  Temp: 98.4 F (36.9 C)  SpO2: 100%     FHT:161 Lab orders placed from triage:  urine

## 2024-05-23 NOTE — MAU Provider Note (Addendum)
 None     S Mary Weber is a 18 y.o. [phone] pregnant female at [redacted]w[redacted]d who presents to MAU today with complaint of abdominal cramping that woke her from sleep at 0300. The pain at this time was 8/10 She reports she attempted to drink water to help, eventually went back to sleep. She called the office this morning when she woke and was still having pain and they advised her to come in. Pain on admit was 6 cm. She reports most of the pain is in her upper abdomen, but she reports it is also present on the sides, made worse with walking, standing and movement.  Denies changes in bowel or bladder habits.   Receives care at [facility]. Prenatal records reviewed.  Pertinent items noted in HPI and remainder of comprehensive ROS otherwise negative.   O BP 123/67 (BP Location: Right Arm)   Pulse (!) 101   Temp 98.4 F (36.9 C) (Oral)   Resp 16   Ht 4' 11 (1.499 m)   Wt 66.7 kg   LMP 01/12/2024   SpO2 100%   BMI 29.69 kg/m  Physical Exam Vitals reviewed.  Constitutional:      General: She is not in acute distress.    Appearance: Normal appearance. She is well-developed. She is not ill-appearing, toxic-appearing or diaphoretic.  HENT:     Head: Normocephalic.  Cardiovascular:     Rate and Rhythm: Normal rate and regular rhythm.     Pulses: Normal pulses.     Heart sounds: Normal heart sounds.  Pulmonary:     Effort: Pulmonary effort is normal.     Breath sounds: Normal breath sounds.  Skin:    General: Skin is warm and dry.     Capillary Refill: Capillary refill takes less than 2 seconds.  Neurological:     General: No focal deficit present.     Mental Status: She is alert and oriented to person, place, and time.  Psychiatric:        Mood and Affect: Mood normal.        Behavior: Behavior normal.      MDM: Moderate Evaluate for abdominal pain in pregnancy Patient able to eat and drink while here Pain improved s/p acetaminophen  Suspect RLP due to pain exacerbated by  movement Possible GERD appropriate to trial Reglan at home for symptom relief.   MAU Course:  A Iron  deficiency anemia, unspecified iron  deficiency anemia type  [redacted] weeks gestation of pregnancy  Epigastric pain  Anemia affecting pregnancy in first trimester - Plan: Ferric Maltol 30 MG CAPS  Round ligament pain  Gastroesophageal reflux disease without esophagitis  Medical screening exam complete  P Discharge from MAU in stable condition with routine precautions Follow up at [facility] as scheduled for ongoing prenatal care - Trial Reglan 10mg  PO BID PRN for epigastric/GERD discomfort.  - Continue acetaminophen  PRN OTC for pain in pregnancy.  - RLP comfort measures provided via MyChart message.  - Restart Accrufer due to continued anemia (patient reports it was previously discontinued).   Allergies as of 05/23/2024       Reactions   Bee Pollen Itching   Pt reports pollen         Medication List     TAKE these medications    acetaminophen -caffeine  500-65 MG Tabs per tablet Commonly known as: EXCEDRIN TENSION HEADACHE Take 2 tablets by mouth every 6 (six) hours as needed (headache).   aspirin EC 81 MG tablet Take 2 tablets (162  mg total) by mouth at bedtime. Start taking when you are [redacted] weeks pregnant for rest of pregnancy for prevention of preeclampsia   cyclobenzaprine  10 MG tablet Commonly known as: FLEXERIL  Take 1 tablet (10 mg total) by mouth 2 (two) times daily as needed (headache, muscle aches).   diphenhydrAMINE  25 MG tablet Commonly known as: BENADRYL  Take 1 tablet (25 mg total) by mouth every 6 (six) hours as needed.   Ferric Maltol 30 MG Caps Take 1 capsule (30 mg total) by mouth 2 (two) times daily. Please take one hour before breakfast and dinner   glycopyrrolate  1 MG tablet Commonly known as: ROBINUL  Take 2 tablets (2 mg total) by mouth 3 (three) times daily as needed for up to 20 doses (spitting).   metoCLOPramide 10 MG tablet Commonly known as:  REGLAN Take 1 tablet (10 mg total) by mouth every 6 (six) hours.   ondansetron  4 MG disintegrating tablet Commonly known as: ZOFRAN -ODT Take 1 tablet (4 mg total) by mouth every 8 (eight) hours as needed for up to 20 doses for nausea or vomiting.   Prenatal 28-0.8 MG Tabs Take 1 tablet by mouth daily.   promethazine  25 MG tablet Commonly known as: PHENERGAN  Take 1 tablet (25 mg total) by mouth every 6 (six) hours as needed for nausea or vomiting.   Transderm-Scop 1 MG/3DAYS Generic drug: scopolamine  Place 1 patch (1.5 mg total) onto the skin every 3 (three) days.   scopolamine  1 MG/3DAYS Commonly known as: TRANSDERM-SCOP Place 1 patch (1.5 mg total) onto the skin every 3 (three) days.        Camie Rote, MSN, CNM 05/23/2024 4:18 PM  Certified Nurse Midwife, [facility]

## 2024-05-24 LAB — GC/CHLAMYDIA PROBE AMP (~~LOC~~) NOT AT ARMC
Chlamydia: NEGATIVE
Comment: NEGATIVE
Comment: NORMAL
Neisseria Gonorrhea: NEGATIVE

## 2024-06-04 ENCOUNTER — Ambulatory Visit (INDEPENDENT_AMBULATORY_CARE_PROVIDER_SITE_OTHER)

## 2024-06-04 VITALS — BP 118/73 | HR 109 | Wt 140.0 lb

## 2024-06-04 DIAGNOSIS — Z8759 Personal history of other complications of pregnancy, childbirth and the puerperium: Secondary | ICD-10-CM

## 2024-06-04 DIAGNOSIS — Z23 Encounter for immunization: Secondary | ICD-10-CM

## 2024-06-04 DIAGNOSIS — Z3A20 20 weeks gestation of pregnancy: Secondary | ICD-10-CM

## 2024-06-04 DIAGNOSIS — D509 Iron deficiency anemia, unspecified: Secondary | ICD-10-CM

## 2024-06-04 DIAGNOSIS — Z3480 Encounter for supervision of other normal pregnancy, unspecified trimester: Secondary | ICD-10-CM | POA: Diagnosis not present

## 2024-06-04 NOTE — Progress Notes (Signed)
   LOW-RISK PREGNANCY OFFICE VISIT  Patient name: Mary Weber MRN [id number]  Date of birth: 11-13-2005 Chief Complaint:   Routine Prenatal Visit (20 weeks ROB )  Subjective:   Mary Weber is a 18 y.o. [phone] female at [redacted]w[redacted]d with an Estimated Date of Delivery: 10/18/24 being seen today for ongoing management of a low-risk pregnancy aeb has BMI 30.0-30.9,adult; History of gestational hypertension; Supervision of other normal pregnancy, antepartum; and Iron  deficiency anemia, unspecified on their problem list.  Patient presents today, with boyfriend, with c/o vivid dreams causing sleep disturbances.  She reports she also experiences night sweats. She reports occasional snacks prior to bed like ice cream, fruit, or chips.   Patient endorses fetal movement. Patient denies abdominal cramping or contractions.  Patient denies vaginal concerns including abnormal discharge, leaking of fluid, and bleeding. No issues with urination, constipation, or diarrhea.    Contractions: Not present. Vag. Bleeding: None.  Movement: Present.  Reviewed past medical,surgical, social, obstetrical and family history as well as problem list, medications and allergies.  Objective   Vitals:   06/04/24 0927  BP: 118/73  Pulse: (!) 109  Weight: 140 lb (63.5 kg)  Body mass index is 28.28 kg/m.  Total Weight Gain:1 lb (0.454 kg)         Physical Examination:   General appearance: Well appearing, and in no distress  Mental status: Alert, oriented to person, place, and time  Skin: Warm & dry  Cardiovascular: Normal heart rate noted  Respiratory: Normal respiratory effort, no distress  Abdomen: Soft, gravid, nontender,   Pelvic: Cervical exam deferred           Extremities: Edema: None  Fetal Status: Fetal Heart Rate (bpm): 156  Movement: Present   No results found for this or any previous visit (from the past 24 hours).   Assessment & Plan   Low-risk pregnancy of a 18 y.o., [id number] at [redacted]w[redacted]d with  an Estimated Date of Delivery: 10/18/24   1. Supervision of other normal pregnancy, antepartum -Anticipatory guidance for upcoming appts. -Patient to schedule next appt in 4 weeks for an in-person visit.  2. [redacted] weeks gestation of pregnancy -Doing well overall. -Discussed sleep disturbances and how nutritional intake, prior to bed can contribute to this.   3. Iron  deficiency anemia, unspecified iron  deficiency anemia type -Previous levels normal. -Anemia profile to be collected today.  -Patient states she restarted iron  supplement last week.   4. History of gestational hypertension -Taking bASA  5. Need for influenza vaccination -Declines    Meds: No orders of the defined types were placed in this encounter.  Labs/procedures today:  Lab Orders         Anemia Profile B      Reviewed: Preterm labor symptoms and general obstetric precautions including but not limited to vaginal bleeding, contractions, leaking of fluid and fetal movement were reviewed in detail with the patient.  All questions were answered.  Follow-up: No follow-ups on file.  Orders Placed This Encounter  Procedures   Anemia Profile B   Harlene LITTIE Duncans MSN, CNM 06/04/2024

## 2024-06-05 LAB — ANEMIA PROFILE B
Basophils Absolute: 0 x10E3/uL (ref 0.0–0.2)
Basos: 0 %
EOS (ABSOLUTE): 0.1 x10E3/uL (ref 0.0–0.4)
Eos: 2 %
Ferritin: 308 ng/mL — ABNORMAL HIGH (ref 15–150)
Folate: 19.8 ng/mL (ref >3.0)
Hematocrit: 29.5 % — ABNORMAL LOW (ref 34.0–46.6)
Hemoglobin: 9.4 g/dL — ABNORMAL LOW (ref 11.1–15.9)
Immature Grans (Abs): 0 x10E3/uL (ref 0.0–0.1)
Immature Granulocytes: 0 %
Iron Saturation: 30 % (ref 15–55)
Iron: 89 ug/dL (ref 27–159)
Lymphocytes Absolute: 1.7 x10E3/uL (ref 0.7–3.1)
Lymphs: 26 %
MCH: 28.6 pg (ref 26.6–33.0)
MCHC: 31.9 g/dL (ref 31.5–35.7)
MCV: 90 fL (ref 79–97)
Monocytes Absolute: 0.4 x10E3/uL (ref 0.1–0.9)
Monocytes: 7 %
Neutrophils Absolute: 4.2 x10E3/uL (ref 1.4–7.0)
Neutrophils: 65 %
Platelets: 231 x10E3/uL (ref 150–450)
RBC: 3.29 x10E6/uL — ABNORMAL LOW (ref 3.77–5.28)
RDW: 13.4 % (ref 11.7–15.4)
Retic Ct Pct: 2.5 % (ref 0.6–2.6)
Total Iron Binding Capacity: 293 ug/dL (ref 250–450)
UIBC: 204 ug/dL (ref 131–425)
Vitamin B-12: 585 pg/mL (ref 232–1245)
WBC: 6.5 x10E3/uL (ref 3.4–10.8)

## 2024-06-06 ENCOUNTER — Ambulatory Visit: Payer: Self-pay

## 2024-06-06 DIAGNOSIS — R7989 Other specified abnormal findings of blood chemistry: Secondary | ICD-10-CM | POA: Insufficient documentation

## 2024-06-19 ENCOUNTER — Other Ambulatory Visit: Payer: Self-pay | Admitting: Obstetrics & Gynecology

## 2024-06-19 ENCOUNTER — Other Ambulatory Visit

## 2024-06-19 ENCOUNTER — Ambulatory Visit: Attending: Maternal & Fetal Medicine | Admitting: Maternal & Fetal Medicine

## 2024-06-19 ENCOUNTER — Encounter: Payer: Self-pay | Admitting: Maternal & Fetal Medicine

## 2024-06-19 VITALS — BP 120/60 | HR 97

## 2024-06-19 DIAGNOSIS — Z1331 Encounter for screening for depression: Secondary | ICD-10-CM

## 2024-06-19 DIAGNOSIS — Z3689 Encounter for other specified antenatal screening: Secondary | ICD-10-CM | POA: Diagnosis not present

## 2024-06-19 DIAGNOSIS — Z3480 Encounter for supervision of other normal pregnancy, unspecified trimester: Secondary | ICD-10-CM

## 2024-06-19 DIAGNOSIS — O365920 Maternal care for other known or suspected poor fetal growth, second trimester, not applicable or unspecified: Secondary | ICD-10-CM

## 2024-06-19 DIAGNOSIS — D649 Anemia, unspecified: Secondary | ICD-10-CM | POA: Insufficient documentation

## 2024-06-19 DIAGNOSIS — O283 Abnormal ultrasonic finding on antenatal screening of mother: Secondary | ICD-10-CM | POA: Insufficient documentation

## 2024-06-19 DIAGNOSIS — Z8759 Personal history of other complications of pregnancy, childbirth and the puerperium: Secondary | ICD-10-CM | POA: Diagnosis not present

## 2024-06-19 DIAGNOSIS — Z3A22 22 weeks gestation of pregnancy: Secondary | ICD-10-CM | POA: Diagnosis not present

## 2024-06-19 DIAGNOSIS — O358XX0 Maternal care for other (suspected) fetal abnormality and damage, not applicable or unspecified: Secondary | ICD-10-CM | POA: Insufficient documentation

## 2024-06-19 DIAGNOSIS — R7989 Other specified abnormal findings of blood chemistry: Secondary | ICD-10-CM

## 2024-06-19 DIAGNOSIS — Z3A12 12 weeks gestation of pregnancy: Secondary | ICD-10-CM

## 2024-06-19 DIAGNOSIS — O99012 Anemia complicating pregnancy, second trimester: Secondary | ICD-10-CM | POA: Insufficient documentation

## 2024-06-19 DIAGNOSIS — O09292 Supervision of pregnancy with other poor reproductive or obstetric history, second trimester: Secondary | ICD-10-CM | POA: Insufficient documentation

## 2024-06-19 DIAGNOSIS — Z363 Encounter for antenatal screening for malformations: Secondary | ICD-10-CM | POA: Insufficient documentation

## 2024-06-19 DIAGNOSIS — O99011 Anemia complicating pregnancy, first trimester: Secondary | ICD-10-CM

## 2024-06-19 DIAGNOSIS — Z7982 Long term (current) use of aspirin: Secondary | ICD-10-CM | POA: Insufficient documentation

## 2024-06-19 NOTE — Progress Notes (Addendum)
 Patient information  Patient Name: Mary Weber [facility]  Patient MRN:   [id number]  Referring practice: MFM Referring Provider: [facility] ([facility])  Problem List   Patient Active Problem List   Diagnosis Date Noted   Fetal echogenic intracardiac focus on prenatal ultrasound 06/19/2024   Poor fetal growth affecting management of mother in second trimester 06/19/2024   Elevated ferritin 06/06/2024   Iron  deficiency anemia, unspecified 03/15/2024   Supervision of other normal pregnancy, antepartum 02/28/2024   History of gestational hypertension 03/31/2023   BMI 30.0-30.9,adult 03/30/2023   Maternal Fetal Medicine Consult ROSHAWN LACINA [facility] is a 18 y.o. [id number] at [redacted]w[redacted]d here for ultrasound and consultation. She had low risk aneuploidy screening of a female fetus. Carrier screening was Negative for the basic screening (SMA, alpha-thal, beta-thal, and cystic fibroisis. Maternal serum AFP was negative. She has no acute concerns.   Growth ultrasound demonstrates estimated fetal weight at the 16th percentile, consistent with small for gestational age but not meeting criteria for fetal growth restriction.  I discussed that the most likely outcome is due to short maternal and paternal stature but if the growth were to drop precipitously it could represent fetal growth restriction.  I discussed that if she were to be diagnosed with fetal growth restriction this does carry an increased risk of adverse obstetric and fetal outcomes such as need for early delivery, stillbirth and associated risk of preeclampsia.  At this time I emphasized the growth is normal but we should have it assessed in 3 weeks. Anatomy is otherwise normal except for an echogenic intracardiac focus, with low-risk NIPT previously documented. This is considered a normal variant. Pregnancy is complicated by a history of gestational hypertension, and the patient is currently taking aspirin  for preeclampsia prophylaxis. She also has a  history of iron  deficiency anemia treated with iron  infusions; most recent ferritin was 308, indicating adequate iron  stores with no current need for iron  replacement. The patient reports good fetal movement.  Recommendations -Continue aspirin  for preeclampsia prophylaxis -No iron  supplementation indicated at this time -CBC each trimester to monitor for recurrent anemia -Repeat growth ultrasound in 3-4 weeks -Routine blood pressure monitoring -Continue routine prenatal care -Fetal movement precautions Delivery planning per standard obstetric indications based on clinical course  60 minutes of time was spent reviewing the patient's chart including labs, imaging and documentation.  At least 50% of this time was spent with direct patient care discussing the diagnosis, management and prognosis of her care.  Review of Systems: A review of systems was performed and was negative except per HPI   Past Obstetrical History:  OB History  Gravida Para Term Preterm AB Living  5 1 1  3 1   SAB IAB Ectopic Multiple Live Births  1 2  0 1    # Outcome Date GA Lbr Len/2nd Weight Sex Type Anes PTL Lv  5 Current           4 SAB 12/2023          3 IAB 04/2023          2 Term 11/27/20 [redacted]w[redacted]d 11:50 / 00:28 6 lb 14.1 oz (3.121 kg) M Vag-Spont EPI  LIV  1 IAB             Obstetric Comments  AB- pills, no complication     Past Medical History:  Past Medical History:  Diagnosis Date   Chlamydia    Gestational hypertension      Past Surgical  History:    Past Surgical History:  Procedure Laterality Date   NO PAST SURGERIES       Home Medications:   Current Outpatient Medications on File Prior to Visit  Medication Sig Dispense Refill   aspirin  EC 81 MG tablet Take 2 tablets (162 mg total) by mouth at bedtime. Start taking when you are [redacted] weeks pregnant for rest of pregnancy for prevention of preeclampsia 300 tablet 2   Prenatal 28-0.8 MG TABS Take 1 tablet by mouth daily. 30 tablet 12    acetaminophen -caffeine  (EXCEDRIN TENSION HEADACHE) 500-65 MG TABS per tablet Take 2 tablets by mouth every 6 (six) hours as needed (headache). (Patient not taking: Reported on 06/04/2024) 60 tablet 0   cyclobenzaprine  (FLEXERIL ) 10 MG tablet Take 1 tablet (10 mg total) by mouth 2 (two) times daily as needed (headache, muscle aches). (Patient not taking: Reported on 06/19/2024) 20 tablet 0   diphenhydrAMINE  (BENADRYL ) 25 MG tablet Take 1 tablet (25 mg total) by mouth every 6 (six) hours as needed. (Patient not taking: Reported on 06/04/2024) 30 tablet 0   Ferric Maltol  30 MG CAPS Take 1 capsule (30 mg total) by mouth 2 (two) times daily. Please take one hour before breakfast and dinner (Patient not taking: Reported on 06/19/2024) 60 capsule 4   glycopyrrolate  (ROBINUL ) 1 MG tablet Take 2 tablets (2 mg total) by mouth 3 (three) times daily as needed for up to 20 doses (spitting). (Patient not taking: Reported on 06/04/2024) 20 tablet 0   metoCLOPramide  (REGLAN ) 10 MG tablet Take 1 tablet (10 mg total) by mouth every 6 (six) hours. (Patient not taking: Reported on 06/19/2024) 30 tablet 0   ondansetron  (ZOFRAN -ODT) 4 MG disintegrating tablet Take 1 tablet (4 mg total) by mouth every 8 (eight) hours as needed for up to 20 doses for nausea or vomiting. (Patient not taking: Reported on 06/19/2024) 20 tablet 1   promethazine  (PHENERGAN ) 25 MG tablet Take 1 tablet (25 mg total) by mouth every 6 (six) hours as needed for nausea or vomiting. (Patient not taking: Reported on 06/04/2024) 30 tablet 0   scopolamine  (TRANSDERM-SCOP) 1 MG/3DAYS Place 1 patch (1.5 mg total) onto the skin every 3 (three) days. (Patient not taking: Reported on 06/04/2024) 10 patch 0   scopolamine  (TRANSDERM-SCOP) 1 MG/3DAYS Place 1 patch (1.5 mg total) onto the skin every 3 (three) days. (Patient not taking: Reported on 06/04/2024) 10 patch 12   No current facility-administered medications on file prior to visit.      Allergies:    Allergies  Allergen Reactions   Bee Pollen Itching    Pt reports pollen      Physical Exam:   Vitals:   06/19/24 1007  BP: 120/60  Pulse: 97   Sitting comfortably on the sonogram table Nonlabored breathing Normal rate and rhythm Abdomen is nontender  Thank you for the opportunity to be involved with this patient's care. Please let us  know if we can be of any further assistance.   Delora Smaller MFM, [facility]   06/19/2024  11:52 AM

## 2024-07-01 ENCOUNTER — Encounter: Admitting: Obstetrics & Gynecology

## 2024-07-01 VITALS — BP 127/80 | HR 99 | Wt 155.1 lb

## 2024-07-01 DIAGNOSIS — Z3A24 24 weeks gestation of pregnancy: Secondary | ICD-10-CM | POA: Diagnosis not present

## 2024-07-01 DIAGNOSIS — Z3480 Encounter for supervision of other normal pregnancy, unspecified trimester: Secondary | ICD-10-CM

## 2024-07-01 DIAGNOSIS — Z8759 Personal history of other complications of pregnancy, childbirth and the puerperium: Secondary | ICD-10-CM

## 2024-07-01 NOTE — Progress Notes (Signed)
 "  PRENATAL VISIT NOTE  Subjective:  Mary Weber is a 18 y.o. [phone] at [redacted]w[redacted]d being seen today for ongoing prenatal care.  She is currently monitored for the following issues for this low-risk pregnancy and has BMI 30.0-30.9,adult; History of gestational hypertension; Supervision of other normal pregnancy, antepartum; Iron  deficiency anemia, unspecified; Elevated ferritin; Fetal echogenic intracardiac focus on prenatal ultrasound; and Poor fetal growth affecting management of mother in second trimester on their problem list.  Patient reports no complaints.  Contractions: Not present. Vag. Bleeding: None.  Movement: Present. Denies leaking of fluid.   The following portions of the patient's history were reviewed and updated as appropriate: allergies, current medications, past family history, past medical history, past social history, past surgical history and problem list.   Objective:   Vitals:   07/01/24 0946  BP: 127/80  Pulse: 99  Weight: 155 lb 1.3 oz (70.3 kg)    Fetal Status:  Fetal Heart Rate (bpm): 152   Movement: Present    General: Alert, oriented and cooperative. Patient is in no acute distress.  Skin: Skin is warm and dry. No rash noted.   Cardiovascular: Normal heart rate noted  Respiratory: Normal respiratory effort, no problems with respiration noted  Abdomen: Soft, gravid, appropriate for gestational age.  Pain/Pressure: Absent     Pelvic: Cervical exam deferred        Extremities: Normal range of motion.  Edema: None  Mental Status: Normal mood and affect. Normal behavior. Normal judgment and thought content.      04/08/2024    9:38 AM 03/30/2023    9:31 AM 11/12/2020    2:44 PM  Depression screen PHQ 2/9  Decreased Interest 2 3 2   Down, Depressed, Hopeless 1 2 1   PHQ - 2 Score 3 5 3   Altered sleeping 3 3 1   Tired, decreased energy 2 3 3   Change in appetite 1 3 2   Feeling bad or failure about yourself  0 1 1  Trouble concentrating 0 1 0  Moving slowly  or fidgety/restless 1 3 0  Suicidal thoughts 0 0 0  PHQ-9 Score 10  19  10       Data saved with a previous flowsheet row definition        04/08/2024    9:38 AM 03/30/2023    9:32 AM 11/12/2020    2:45 PM  GAD 7 : Generalized Anxiety Score  Nervous, Anxious, on Edge 0 3 1  Control/stop worrying 0 3 1  Worry too much - different things 0 3 1  Trouble relaxing 0 2 0  Restless 0 3 0  Easily annoyed or irritable 2 3 3   Afraid - awful might happen 1 1 1   Total GAD 7 Score 3 18 7     Assessment and Plan:  Pregnancy: G5P1031 at [redacted]w[redacted]d 1. [redacted] weeks gestation of pregnancy (Primary)   2. Supervision of other normal pregnancy, antepartum   3. History of gestational hypertension Nl BP  Preterm labor symptoms and general obstetric precautions including but not limited to vaginal bleeding, contractions, leaking of fluid and fetal movement were reviewed in detail with the patient. Please refer to After Visit Summary for other counseling recommendations.   Return in about 4 weeks (around 07/29/2024).  Future Appointments  Date Time Provider Department Center  07/17/2024  3:15 PM [facility] PROVIDER 1 [facility]  07/17/2024  3:30 PM [facility] US3 [facility]  07/30/2024  8:35 AM Synthia Raisin, CNM [facility] None    [place]  Eveline, MD "

## 2024-07-17 ENCOUNTER — Ambulatory Visit: Admitting: Obstetrics

## 2024-07-17 ENCOUNTER — Other Ambulatory Visit: Payer: Self-pay | Admitting: Maternal & Fetal Medicine

## 2024-07-17 ENCOUNTER — Other Ambulatory Visit: Payer: Self-pay | Admitting: *Deleted

## 2024-07-17 ENCOUNTER — Ambulatory Visit: Attending: Obstetrics and Gynecology

## 2024-07-17 VITALS — BP 121/71 | HR 99

## 2024-07-17 DIAGNOSIS — D649 Anemia, unspecified: Secondary | ICD-10-CM

## 2024-07-17 DIAGNOSIS — O365920 Maternal care for other known or suspected poor fetal growth, second trimester, not applicable or unspecified: Secondary | ICD-10-CM | POA: Insufficient documentation

## 2024-07-17 DIAGNOSIS — O283 Abnormal ultrasonic finding on antenatal screening of mother: Secondary | ICD-10-CM

## 2024-07-17 DIAGNOSIS — O99012 Anemia complicating pregnancy, second trimester: Secondary | ICD-10-CM | POA: Diagnosis not present

## 2024-07-17 DIAGNOSIS — O09292 Supervision of pregnancy with other poor reproductive or obstetric history, second trimester: Secondary | ICD-10-CM

## 2024-07-17 DIAGNOSIS — Z8759 Personal history of other complications of pregnancy, childbirth and the puerperium: Secondary | ICD-10-CM

## 2024-07-17 DIAGNOSIS — Z3A26 26 weeks gestation of pregnancy: Secondary | ICD-10-CM | POA: Insufficient documentation

## 2024-07-17 DIAGNOSIS — O09299 Supervision of pregnancy with other poor reproductive or obstetric history, unspecified trimester: Secondary | ICD-10-CM

## 2024-07-17 DIAGNOSIS — O35EXX0 Maternal care for other (suspected) fetal abnormality and damage, fetal genitourinary anomalies, not applicable or unspecified: Secondary | ICD-10-CM

## 2024-07-17 NOTE — Progress Notes (Signed)
 MFM Consult Note  Mary Weber is currently at [redacted]w[redacted]d. She has been followed as a small for gestational age fetus was noted during her last exam.  She denies any problems since her last exam and reports feeling fetal movements throughout the day.    Her blood pressure today was 121/71.  Sonographic findings Single intrauterine pregnancy at 26w 5d. Fetal cardiac activity:  Observed and appears normal. Presentation: Breech. Fetal biometry shows the estimated fetal weight of 1 lb 13 oz, 823 grams (7%), indicating IUGR. Amniotic fluid: Within normal limits. MVP: 3.73 cm. Placenta: Anterior.  Doppler studies of the umbilical arteries performed today showed a normal S/D ratio of 3.61.  There were no signs of absent or reversed end-diastolic flow.  IUGR  The patient was reassured that IUGR is a common finding.  Most cases of IUGR result in the delivery of a healthy infant at or close to term.  Due to IUGR, she will return in 2 weeks for an NST and umbilical artery Doppler study.  Should IUGR continue to be noted later in her pregnancy, delivery will be recommended at around 38 weeks.    She was advised to continue to monitor fetal movements on a daily basis.    She should continue taking a daily baby aspirin  for preeclampsia prophylaxis.  The patient stated that all of her questions were answered.   A total of 20 minutes was spent counseling and coordinating the care for this patient.  Greater than 50% of the time was spent in direct face-to-face contact.

## 2024-07-30 ENCOUNTER — Ambulatory Visit (INDEPENDENT_AMBULATORY_CARE_PROVIDER_SITE_OTHER)

## 2024-07-30 VITALS — BP 107/58 | HR 108 | Wt 163.0 lb

## 2024-07-30 DIAGNOSIS — Z3480 Encounter for supervision of other normal pregnancy, unspecified trimester: Secondary | ICD-10-CM

## 2024-07-30 DIAGNOSIS — O365920 Maternal care for other known or suspected poor fetal growth, second trimester, not applicable or unspecified: Secondary | ICD-10-CM

## 2024-07-30 DIAGNOSIS — Z3A28 28 weeks gestation of pregnancy: Secondary | ICD-10-CM | POA: Diagnosis not present

## 2024-07-30 DIAGNOSIS — O99213 Obesity complicating pregnancy, third trimester: Secondary | ICD-10-CM

## 2024-07-30 DIAGNOSIS — D509 Iron deficiency anemia, unspecified: Secondary | ICD-10-CM

## 2024-07-30 NOTE — Progress Notes (Addendum)
 "   HIGH-RISK PREGNANCY OFFICE VISIT  Patient name: Mary Weber MRN [id number]  Date of birth: 09/29/2005 Chief Complaint:   Routine Prenatal Visit  Subjective:   Mary Weber is a 19 y.o. G24P1031 female at [redacted]w[redacted]d with an Estimated Date of Delivery: 10/18/24 being seen today for ongoing management of a high-risk pregnancy aeb has BMI 30.0-30.9,adult; History of gestational hypertension; Supervision of other normal pregnancy, antepartum; Iron  deficiency anemia, unspecified; Elevated ferritin; Fetal echogenic intracardiac focus on prenatal ultrasound; and Poor fetal growth affecting management of mother in second trimester on their problem list.  Patient presents today, with boyfriend and toddler son, with sleep disturbances.  She reports continued vivid dreams causing sleep disturbances.  She states she has discontinued night time snacks with no improvement. She denies stress or anxiety.   Patient endorses fetal movement. Patient denies abdominal cramping or contractions.  Patient denies vaginal concerns including abnormal discharge, leaking of fluid, and bleeding. No issues with urination, constipation, or diarrhea.    Contractions: Not present. Vag. Bleeding: None.  Movement: Present.  Reviewed past medical,surgical, social, obstetrical and family history as well as problem list, medications and allergies.  Objective   Vitals:   07/30/24 0854  BP: (!) 107/58  Pulse: (!) 108  Weight: 163 lb (73.9 kg)  Body mass index is 32.92 kg/m.  Total Weight Gain:24 lb (10.9 kg)         Physical Examination:   General appearance: Well appearing, and in no distress  Mental status: Alert, oriented to person, place, and time  Skin: Warm & dry  Cardiovascular: Normal heart rate noted  Respiratory: Normal respiratory effort, no distress  Abdomen: Soft, gravid, nontender, AGA with Fundal Height: 29 cm  Pelvic: Cervical exam deferred           Extremities: Edema: None  Fetal Status: Fetal  Heart Rate (bpm): 137  Movement: Present   No results found for this or any previous visit (from the past 24 hours).      07/30/2024    9:03 AM 04/08/2024    9:38 AM 03/30/2023    9:31 AM 11/12/2020    2:44 PM  Depression screen PHQ 2/9  Decreased Interest 2 2 3 2   Down, Depressed, Hopeless 1 1 2 1   PHQ - 2 Score 3 3 5 3   Altered sleeping 3 3 3 1   Tired, decreased energy 2 2 3 3   Change in appetite 0 1 3 2   Feeling bad or failure about yourself  0 0 1 1  Trouble concentrating 0 0 1 0  Moving slowly or fidgety/restless 0 1 3 0  Suicidal thoughts 0 0 0 0  PHQ-9 Score 8 10  19  10       Data saved with a previous flowsheet row definition       07/30/2024    9:05 AM 04/08/2024    9:38 AM 03/30/2023    9:32 AM 11/12/2020    2:45 PM  GAD 7 : Generalized Anxiety Score  Nervous, Anxious, on Edge 0 0  3  1   Control/stop worrying 0 0  3  1   Worry too much - different things 1 0  3  1   Trouble relaxing 0 0  2  0   Restless 0 0  3  0   Easily annoyed or irritable 3 2  3  3    Afraid - awful might happen 1 1  1  1    Total GAD  7 Score 5 3 18 7      Data saved with a previous flowsheet row definition   Assessment & Plan   High-risk pregnancy of a 19 y.o., [id number] at [redacted]w[redacted]d with an Estimated Date of Delivery: 10/18/24   1. Supervision of other normal pregnancy, antepartum -Anticipatory guidance for upcoming appts. -Patient to schedule next appt in 2 weeks for an in-person visit. -Glucola appt completed today.  -Reviewed blood draw procedures and labs which also include check of iron /HgB level, RPR, and HIV *Informed that repeat RPR/HIV are for pediatric records/compliance.  -Discussed how results of GTT are handled including diabetic education and BS testing for abnormal results and routine care for normal results.    2. [redacted] weeks gestation of pregnancy -Reviewed concerns with dreams.  -Informed that hormones can cause dreams to become more vivid during pregnancy.  -Encouraged bedtime  routine. May consider sleep aide.  -Patient instructed to send in FMLA paperwork, for completion, via mychart message. -Informed that [facility] will be used for initial OOW date, but can be modified based on MFM recommendation.   3. Iron  deficiency anemia, unspecified iron  deficiency anemia type -Anemia profile repeated today.   4. Poor fetal growth affecting management of mother in second trimester, single or unspecified fetus -Imaging scheduled for Jan 22 -FH appropriate  5. Maternal morbid obesity in third trimester, antepartum (HCC) -Taking bASA -TWG 24 lbs     Meds: No orders of the defined types were placed in this encounter.  Labs/procedures today: Lab Orders         CBC         Glucose Tolerance, 2 Hours w/1 Hour         HIV Antibody (routine testing w rflx)         RPR W/RFLX TO RPR TITER, TREPONEMAL AB, SCREEN AND DIAGNOSIS         Anemia Profile B      Reviewed: Preterm labor symptoms and general obstetric precautions including but not limited to vaginal bleeding, contractions, leaking of fluid and fetal movement were reviewed in detail with the patient.  All questions were answered.  Follow-up: No follow-ups on file.  Orders Placed This Encounter  Procedures   CBC   Glucose Tolerance, 2 Hours w/1 Hour   HIV Antibody (routine testing w rflx)   RPR W/RFLX TO RPR TITER, TREPONEMAL AB, SCREEN AND DIAGNOSIS   Anemia Profile B   Harlene LITTIE Duncans MSN, CNM 07/30/2024  "

## 2024-07-31 LAB — ANEMIA PROFILE B
Basophils Absolute: 0 x10E3/uL (ref 0.0–0.2)
Basos: 0 %
EOS (ABSOLUTE): 0.1 x10E3/uL (ref 0.0–0.4)
Eos: 2 %
Ferritin: 94 ng/mL (ref 15–150)
Folate: 12.9 ng/mL
Hematocrit: 31.7 % — ABNORMAL LOW (ref 34.0–46.6)
Hemoglobin: 10.4 g/dL — ABNORMAL LOW (ref 11.1–15.9)
Immature Grans (Abs): 0 x10E3/uL (ref 0.0–0.1)
Immature Granulocytes: 0 %
Iron Saturation: 21 % (ref 15–55)
Iron: 73 ug/dL (ref 27–159)
Lymphocytes Absolute: 1.6 x10E3/uL (ref 0.7–3.1)
Lymphs: 23 %
MCH: 29.5 pg (ref 26.6–33.0)
MCHC: 32.8 g/dL (ref 31.5–35.7)
MCV: 90 fL (ref 79–97)
Monocytes Absolute: 0.5 x10E3/uL (ref 0.1–0.9)
Monocytes: 7 %
Neutrophils Absolute: 4.6 x10E3/uL (ref 1.4–7.0)
Neutrophils: 68 %
Platelets: 217 x10E3/uL (ref 150–450)
RBC: 3.52 x10E6/uL — ABNORMAL LOW (ref 3.77–5.28)
RDW: 12.1 % (ref 11.7–15.4)
Retic Ct Pct: 2 % (ref 0.6–2.6)
Total Iron Binding Capacity: 343 ug/dL (ref 250–450)
UIBC: 270 ug/dL (ref 131–425)
Vitamin B-12: 537 pg/mL (ref 232–1245)
WBC: 6.8 x10E3/uL (ref 3.4–10.8)

## 2024-07-31 LAB — GLUCOSE TOLERANCE, 2 HOURS W/ 1HR
Glucose, 1 hour: 71 mg/dL (ref 70–179)
Glucose, 2 hour: 73 mg/dL (ref 70–152)
Glucose, Fasting: 68 mg/dL — ABNORMAL LOW (ref 70–91)

## 2024-07-31 LAB — HIV ANTIBODY (ROUTINE TESTING W REFLEX): HIV Screen 4th Generation wRfx: NONREACTIVE

## 2024-07-31 LAB — SYPHILIS: RPR W/REFLEX TO RPR TITER AND TREPONEMAL ANTIBODIES, TRADITIONAL SCREENING AND DIAGNOSIS ALGORITHM: RPR Ser Ql: NONREACTIVE

## 2024-08-01 ENCOUNTER — Ambulatory Visit: Attending: Obstetrics and Gynecology

## 2024-08-01 ENCOUNTER — Ambulatory Visit (HOSPITAL_BASED_OUTPATIENT_CLINIC_OR_DEPARTMENT_OTHER)

## 2024-08-01 ENCOUNTER — Ambulatory Visit (HOSPITAL_BASED_OUTPATIENT_CLINIC_OR_DEPARTMENT_OTHER): Admitting: Obstetrics

## 2024-08-01 DIAGNOSIS — O35BXX0 Maternal care for other (suspected) fetal abnormality and damage, fetal cardiac anomalies, not applicable or unspecified: Secondary | ICD-10-CM

## 2024-08-01 DIAGNOSIS — O09293 Supervision of pregnancy with other poor reproductive or obstetric history, third trimester: Secondary | ICD-10-CM

## 2024-08-01 DIAGNOSIS — Z3A28 28 weeks gestation of pregnancy: Secondary | ICD-10-CM | POA: Insufficient documentation

## 2024-08-01 DIAGNOSIS — Z8759 Personal history of other complications of pregnancy, childbirth and the puerperium: Secondary | ICD-10-CM | POA: Diagnosis present

## 2024-08-01 DIAGNOSIS — Z3A38 38 weeks gestation of pregnancy: Secondary | ICD-10-CM

## 2024-08-01 DIAGNOSIS — O99012 Anemia complicating pregnancy, second trimester: Secondary | ICD-10-CM | POA: Diagnosis present

## 2024-08-01 DIAGNOSIS — O365920 Maternal care for other known or suspected poor fetal growth, second trimester, not applicable or unspecified: Secondary | ICD-10-CM | POA: Insufficient documentation

## 2024-08-01 DIAGNOSIS — O09299 Supervision of pregnancy with other poor reproductive or obstetric history, unspecified trimester: Secondary | ICD-10-CM | POA: Diagnosis present

## 2024-08-01 DIAGNOSIS — O365930 Maternal care for other known or suspected poor fetal growth, third trimester, not applicable or unspecified: Secondary | ICD-10-CM | POA: Diagnosis not present

## 2024-08-01 NOTE — Progress Notes (Signed)
 MFM Consult Note  Mary Weber is currently at [redacted]w[redacted]d. She has been followed due to IUGR.  She denies any problems since her last exam and reports feeling fetal movements throughout the day.   She has screened negative for gestational diabetes in her current pregnancy.  Sonographic findings Single intrauterine pregnancy at 28w 6d. Fetal cardiac activity:  Observed and appears normal. Presentation: Cephalic. Amniotic fluid: Within normal limits.  MVP: 4.16 cm. Placenta: Anterior.  She had a reactive NST for her gestational age today.  Doppler studies of the umbilical arteries showed a normal S/D ratio of 3.31.  There were no signs of absent or reversed end-diastolic flow.    Due to IUGR, she will return in 1 week for another growth scan, umbilical artery Doppler study, and NST.  Should IUGR continue to be noted later in her pregnancy and the fetal testing remains within normal limits, delivery will be recommended at around 38 weeks.    Fetal kick count instructions were reviewed.  The patient stated that all of her questions were answered.   A total of 20 minutes was spent counseling and coordinating the care for this patient.  Greater than 50% of the time was spent in direct face-to-face contact.

## 2024-08-01 NOTE — Procedures (Signed)
 Mary Weber 11/27/2005 [redacted]w[redacted]d  Fetus A Non-Stress Test Interpretation for 08/01/24  Indication: IUGR  Fetal Heart Rate A Mode: External Baseline Rate (A): 130 bpm Variability: Moderate Accelerations: 15 x 15 Decelerations: None Multiple birth?: No  Uterine Activity Mode: Palpation, Toco Contraction Frequency (min): none noted Resting Tone Palpated: Relaxed  Interpretation (Fetal Testing) Nonstress Test Interpretation: Reactive Comments: Reviewed with Dr. Ileana

## 2024-08-03 ENCOUNTER — Ambulatory Visit: Payer: Self-pay

## 2024-08-08 ENCOUNTER — Ambulatory Visit

## 2024-08-08 ENCOUNTER — Ambulatory Visit: Attending: Obstetrics and Gynecology

## 2024-08-08 ENCOUNTER — Ambulatory Visit (HOSPITAL_BASED_OUTPATIENT_CLINIC_OR_DEPARTMENT_OTHER): Admitting: Obstetrics and Gynecology

## 2024-08-08 VITALS — BP 127/66 | HR 104

## 2024-08-08 DIAGNOSIS — O09299 Supervision of pregnancy with other poor reproductive or obstetric history, unspecified trimester: Secondary | ICD-10-CM | POA: Diagnosis present

## 2024-08-08 DIAGNOSIS — O365930 Maternal care for other known or suspected poor fetal growth, third trimester, not applicable or unspecified: Secondary | ICD-10-CM

## 2024-08-08 DIAGNOSIS — O35EXX0 Maternal care for other (suspected) fetal abnormality and damage, fetal genitourinary anomalies, not applicable or unspecified: Secondary | ICD-10-CM

## 2024-08-08 DIAGNOSIS — Z8759 Personal history of other complications of pregnancy, childbirth and the puerperium: Secondary | ICD-10-CM | POA: Insufficient documentation

## 2024-08-08 DIAGNOSIS — Z3A29 29 weeks gestation of pregnancy: Secondary | ICD-10-CM

## 2024-08-08 DIAGNOSIS — O09293 Supervision of pregnancy with other poor reproductive or obstetric history, third trimester: Secondary | ICD-10-CM

## 2024-08-08 DIAGNOSIS — O365920 Maternal care for other known or suspected poor fetal growth, second trimester, not applicable or unspecified: Secondary | ICD-10-CM | POA: Diagnosis present

## 2024-08-08 DIAGNOSIS — O99012 Anemia complicating pregnancy, second trimester: Secondary | ICD-10-CM | POA: Insufficient documentation

## 2024-08-08 NOTE — Progress Notes (Signed)
 After review, MFM consult with provider is not indicated for today  Arna Ranks, MD 08/08/2024 2:32 PM  [facility]

## 2024-08-12 ENCOUNTER — Telehealth: Payer: Self-pay

## 2024-08-12 NOTE — Telephone Encounter (Signed)
 Left voicemail for patient to contact office back to reschedule her appointment due to inclement weather.

## 2024-08-13 ENCOUNTER — Encounter

## 2024-08-15 ENCOUNTER — Other Ambulatory Visit

## 2024-08-15 DIAGNOSIS — R7989 Other specified abnormal findings of blood chemistry: Secondary | ICD-10-CM

## 2024-08-20 ENCOUNTER — Encounter

## 2024-08-22 ENCOUNTER — Other Ambulatory Visit

## 2024-08-27 ENCOUNTER — Encounter

## 2024-08-29 ENCOUNTER — Other Ambulatory Visit

## 2024-09-10 ENCOUNTER — Encounter
